# Patient Record
Sex: Female | Born: 1935 | Race: White | Hispanic: No | Smoking: Never smoker
Health system: Southern US, Community
[De-identification: ages and names within clinical notes are randomized; demographics above are authoritative.]

## PROBLEM LIST (undated history)

## (undated) DIAGNOSIS — K219 Gastro-esophageal reflux disease without esophagitis: Secondary | ICD-10-CM

## (undated) DIAGNOSIS — E119 Type 2 diabetes mellitus without complications: Secondary | ICD-10-CM

## (undated) DIAGNOSIS — M1990 Unspecified osteoarthritis, unspecified site: Secondary | ICD-10-CM

## (undated) DIAGNOSIS — R238 Other skin changes: Secondary | ICD-10-CM

## (undated) DIAGNOSIS — N644 Mastodynia: Secondary | ICD-10-CM

## (undated) DIAGNOSIS — G629 Polyneuropathy, unspecified: Secondary | ICD-10-CM

## (undated) DIAGNOSIS — R233 Spontaneous ecchymoses: Secondary | ICD-10-CM

## (undated) DIAGNOSIS — M4310 Spondylolisthesis, site unspecified: Secondary | ICD-10-CM

## (undated) DIAGNOSIS — I10 Essential (primary) hypertension: Secondary | ICD-10-CM

## (undated) DIAGNOSIS — C801 Malignant (primary) neoplasm, unspecified: Secondary | ICD-10-CM

## (undated) DIAGNOSIS — IMO0001 Reserved for inherently not codable concepts without codable children: Secondary | ICD-10-CM

## (undated) DIAGNOSIS — M109 Gout, unspecified: Secondary | ICD-10-CM

## (undated) DIAGNOSIS — T8859XA Other complications of anesthesia, initial encounter: Secondary | ICD-10-CM

## (undated) DIAGNOSIS — T4145XA Adverse effect of unspecified anesthetic, initial encounter: Secondary | ICD-10-CM

## (undated) DIAGNOSIS — E785 Hyperlipidemia, unspecified: Secondary | ICD-10-CM

## (undated) HISTORY — PX: ABDOMINAL HYSTERECTOMY: SHX81

## (undated) HISTORY — PX: BREAST SURGERY: SHX581

---

## 2014-08-09 ENCOUNTER — Other Ambulatory Visit: Payer: Self-pay | Admitting: Neurosurgery

## 2014-08-21 NOTE — Pre-Procedure Instructions (Signed)
SHINIQUA GROSECLOSE  08/21/2014   Your procedure is scheduled on:  Tuesday Aug 29, 2014 at 8:00 AM.  Report to [facility] Admitting at 5:30 AM.  Call this number if you have problems the morning of surgery: [phone]   Remember:   Do not eat food or drink liquids after midnight.   Take these medicines the morning of surgery with A SIP OF WATER: Allopruinol, Amlodipine, Gabapentin, Arimidex, Aricept   Please stop taking any vitamins, Fish oil, Ibuprofen, Advil, Motrin, Alleve, etc   Do NOT take any diabetic medications the morning of your surgery (Ex. Metformin or Glimerpride)    Do not wear jewelry, make-up or nail polish.  Do not wear lotions, powders, or perfumes.  Do not shave 48 hours prior to surgery.  Do not bring valuables to the hospital.  [facility] is not responsible for any belongings or valuables.               Contacts, dentures or bridgework may not be worn into surgery.  Leave suitcase in the car. After surgery it may be brought to your room.  For patients admitted to the hospital, discharge time is determined by your treatment team.               Patients discharged the day of surgery will not be allowed to drive home.  Name and phone number of your driver:   Special Instructions: Shower using CHG soap the night before and the morning of your surgery   Please read over the following fact sheets that you were given: Pain Booklet, Coughing and Deep Breathing, Blood Transfusion Information, MRSA Information and Surgical Site Infection Prevention

## 2014-08-22 ENCOUNTER — Encounter (HOSPITAL_COMMUNITY): Payer: Self-pay

## 2014-08-22 ENCOUNTER — Encounter (HOSPITAL_COMMUNITY)
Admission: RE | Admit: 2014-08-22 | Discharge: 2014-08-22 | Disposition: A | Discharge status: Home / Self Care | Payer: Medicare Other | Source: Ambulatory Visit | Attending: Neurosurgery | Admitting: Neurosurgery

## 2014-08-22 DIAGNOSIS — Z79899 Other long term (current) drug therapy: Secondary | ICD-10-CM | POA: Diagnosis not present

## 2014-08-22 DIAGNOSIS — Z01810 Encounter for preprocedural cardiovascular examination: Secondary | ICD-10-CM | POA: Insufficient documentation

## 2014-08-22 DIAGNOSIS — M4310 Spondylolisthesis, site unspecified: Secondary | ICD-10-CM | POA: Diagnosis not present

## 2014-08-22 DIAGNOSIS — Z0183 Encounter for blood typing: Secondary | ICD-10-CM | POA: Insufficient documentation

## 2014-08-22 DIAGNOSIS — Z01812 Encounter for preprocedural laboratory examination: Secondary | ICD-10-CM | POA: Diagnosis present

## 2014-08-22 HISTORY — DX: Malignant (primary) neoplasm, unspecified: C80.1

## 2014-08-22 HISTORY — DX: Spondylolisthesis, site unspecified: M43.10

## 2014-08-22 HISTORY — DX: Hyperlipidemia, unspecified: E78.5

## 2014-08-22 HISTORY — DX: Type 2 diabetes mellitus without complications: E11.9

## 2014-08-22 HISTORY — DX: Other skin changes: R23.8

## 2014-08-22 HISTORY — DX: Gout, unspecified: M10.9

## 2014-08-22 HISTORY — DX: Gastro-esophageal reflux disease without esophagitis: K21.9

## 2014-08-22 HISTORY — DX: Reserved for inherently not codable concepts without codable children: IMO0001

## 2014-08-22 HISTORY — DX: Unspecified osteoarthritis, unspecified site: M19.90

## 2014-08-22 HISTORY — DX: Mastodynia: N64.4

## 2014-08-22 HISTORY — DX: Other complications of anesthesia, initial encounter: T88.59XA

## 2014-08-22 HISTORY — DX: Adverse effect of unspecified anesthetic, initial encounter: T41.45XA

## 2014-08-22 HISTORY — DX: Polyneuropathy, unspecified: G62.9

## 2014-08-22 HISTORY — DX: Essential (primary) hypertension: I10

## 2014-08-22 HISTORY — DX: Spontaneous ecchymoses: R23.3

## 2014-08-22 LAB — CBC WITH DIFFERENTIAL/PLATELET
BASOS PCT: 1 % (ref 0–1)
Basophils Absolute: 0.1 10*3/uL (ref 0.0–0.1)
EOS PCT: 5 % (ref 0–5)
Eosinophils Absolute: 0.4 10*3/uL (ref 0.0–0.7)
HCT: 41.3 % (ref 36.0–46.0)
Hemoglobin: 14.5 g/dL (ref 12.0–15.0)
LYMPHS ABS: 3.4 10*3/uL (ref 0.7–4.0)
LYMPHS PCT: 48 % — AB (ref 12–46)
MCH: 31.9 pg (ref 26.0–34.0)
MCHC: 35.1 g/dL (ref 30.0–36.0)
MCV: 90.8 fL (ref 78.0–100.0)
MONO ABS: 0.5 10*3/uL (ref 0.1–1.0)
Monocytes Relative: 7 % (ref 3–12)
Neutro Abs: 2.8 10*3/uL (ref 1.7–7.7)
Neutrophils Relative %: 39 % — ABNORMAL LOW (ref 43–77)
Platelets: 235 10*3/uL (ref 150–400)
RBC: 4.55 MIL/uL (ref 3.87–5.11)
RDW: 13.1 % (ref 11.5–15.5)
WBC: 7.1 10*3/uL (ref 4.0–10.5)

## 2014-08-22 LAB — BASIC METABOLIC PANEL
Anion gap: 12 (ref 5–15)
BUN: 18 mg/dL (ref 6–20)
CALCIUM: 10.6 mg/dL — AB (ref 8.9–10.3)
CO2: 21 mmol/L — ABNORMAL LOW (ref 22–32)
Chloride: 105 mmol/L (ref 101–111)
Creatinine, Ser: 0.82 mg/dL (ref 0.44–1.00)
GFR calc Af Amer: >60 mL/min (ref >60)
GLUCOSE: 113 mg/dL — AB (ref 65–99)
Potassium: 3.6 mmol/L (ref 3.5–5.1)
SODIUM: 138 mmol/L (ref 135–145)

## 2014-08-22 LAB — TYPE AND SCREEN
ABO/RH(D): O NEG
ANTIBODY SCREEN: NEGATIVE

## 2014-08-22 LAB — ABO/RH: ABO/RH(D): O NEG

## 2014-08-22 LAB — SURGICAL PCR SCREEN
MRSA, PCR: NEGATIVE
Staphylococcus aureus: NEGATIVE

## 2014-08-22 LAB — GLUCOSE, CAPILLARY: Glucose-Capillary: 112 mg/dL — ABNORMAL HIGH (ref 65–99)

## 2014-08-22 NOTE — Progress Notes (Signed)
Patient arrived to PAT with Amy Rice (patients son). Patient is alert and oriented but had some difficulty recalling past medical history. PCP is [facility] in [place]. Patient denied having any acute cardiac or pulmonary issues. Patients son stated he thought patient had a stress test with Dr. Woody Seller in [place], however he was unsure as to when it occurred, and stated she has not seen Dr Woody Seller in years. Patient denied having a cardiac cath or sleep study.

## 2014-08-23 LAB — HEMOGLOBIN A1C
Hgb A1c MFr Bld: 5.7 % — ABNORMAL HIGH (ref 4.8–5.6)
MEAN PLASMA GLUCOSE: 117 mg/dL

## 2014-08-28 NOTE — Anesthesia Preprocedure Evaluation (Signed)
Anesthesia Evaluation  Patient identified by MRN, date of birth, ID band Patient awake    Reviewed: Allergy & Precautions, NPO status , Patient's Chart, lab work & pertinent test results  History of Anesthesia Complications (+) history of anesthetic complications (confusion)  Airway Mallampati: II  TM Distance: >3 FB Neck ROM: Full    Dental no notable dental hx. (+) Dental Advisory Given   Pulmonary shortness of breath and with exertion,  breath sounds clear to auscultation  Pulmonary exam normal       Cardiovascular hypertension, Pt. on medications Normal cardiovascular examRhythm:Regular Rate:Normal     Neuro/Psych negative neurological ROS  negative psych ROS   GI/Hepatic Neg liver ROS, GERD-  Medicated and Controlled,  Endo/Other  diabetes, Well Controlled, Type 2, Oral Hypoglycemic Agents  Renal/GU negative Renal ROS  negative genitourinary   Musculoskeletal  (+) Arthritis -, Osteoarthritis,    Abdominal   Peds negative pediatric ROS (+)  Hematology negative hematology ROS (+)   Anesthesia Other Findings   Reproductive/Obstetrics negative OB ROS                             Anesthesia Physical Anesthesia Plan  ASA: II  Anesthesia Plan: General   Post-op Pain Management:    Induction: Intravenous  Airway Management Planned: Oral ETT  Additional Equipment:   Intra-op Plan:   Post-operative Plan: Extubation in OR  Informed Consent: I have reviewed the patients History and Physical, chart, labs and discussed the procedure including the risks, benefits and alternatives for the proposed anesthesia with the patient or authorized representative who has indicated his/her understanding and acceptance.   Dental advisory given  Plan Discussed with: CRNA  Anesthesia Plan Comments:         Anesthesia Quick Evaluation

## 2014-08-29 ENCOUNTER — Inpatient Hospital Stay (HOSPITAL_COMMUNITY): Payer: Medicare Other | Admitting: Anesthesiology

## 2014-08-29 ENCOUNTER — Encounter (HOSPITAL_COMMUNITY)
Admission: RE | Disposition: A | Discharge status: Home / Self Care | Payer: Medicare Other | Source: Ambulatory Visit | Attending: Neurosurgery

## 2014-08-29 ENCOUNTER — Inpatient Hospital Stay (HOSPITAL_COMMUNITY): Payer: Medicare Other

## 2014-08-29 ENCOUNTER — Inpatient Hospital Stay (HOSPITAL_COMMUNITY)
Admission: RE | Admit: 2014-08-29 | Discharge: 2014-08-30 | DRG: 460 | Disposition: A | Discharge status: Home / Self Care | Payer: Medicare Other | Source: Ambulatory Visit | Attending: Neurosurgery | Admitting: Neurosurgery

## 2014-08-29 ENCOUNTER — Encounter (HOSPITAL_COMMUNITY): Payer: Self-pay | Admitting: *Deleted

## 2014-08-29 DIAGNOSIS — E785 Hyperlipidemia, unspecified: Secondary | ICD-10-CM | POA: Diagnosis present

## 2014-08-29 DIAGNOSIS — E1142 Type 2 diabetes mellitus with diabetic polyneuropathy: Secondary | ICD-10-CM | POA: Diagnosis present

## 2014-08-29 DIAGNOSIS — M48062 Spinal stenosis, lumbar region with neurogenic claudication: Secondary | ICD-10-CM | POA: Diagnosis present

## 2014-08-29 DIAGNOSIS — Z7982 Long term (current) use of aspirin: Secondary | ICD-10-CM

## 2014-08-29 DIAGNOSIS — K219 Gastro-esophageal reflux disease without esophagitis: Secondary | ICD-10-CM | POA: Diagnosis present

## 2014-08-29 DIAGNOSIS — M549 Dorsalgia, unspecified: Secondary | ICD-10-CM | POA: Diagnosis present

## 2014-08-29 DIAGNOSIS — M4316 Spondylolisthesis, lumbar region: Secondary | ICD-10-CM | POA: Diagnosis present

## 2014-08-29 DIAGNOSIS — I10 Essential (primary) hypertension: Secondary | ICD-10-CM | POA: Diagnosis present

## 2014-08-29 DIAGNOSIS — M4310 Spondylolisthesis, site unspecified: Secondary | ICD-10-CM | POA: Diagnosis present

## 2014-08-29 DIAGNOSIS — M4806 Spinal stenosis, lumbar region: Secondary | ICD-10-CM | POA: Diagnosis present

## 2014-08-29 DIAGNOSIS — Z419 Encounter for procedure for purposes other than remedying health state, unspecified: Secondary | ICD-10-CM

## 2014-08-29 LAB — GLUCOSE, CAPILLARY
GLUCOSE-CAPILLARY: 193 mg/dL — AB (ref 65–99)
Glucose-Capillary: 111 mg/dL — ABNORMAL HIGH (ref 65–99)
Glucose-Capillary: 142 mg/dL — ABNORMAL HIGH (ref 65–99)
Glucose-Capillary: 125 mg/dL — ABNORMAL HIGH (ref 65–99)
Glucose-Capillary: 178 mg/dL — ABNORMAL HIGH (ref 65–99)

## 2014-08-29 SURGERY — POSTERIOR LUMBAR FUSION 1 LEVEL
Anesthesia: General | Site: Back

## 2014-08-29 MED ORDER — ACETAMINOPHEN 650 MG RE SUPP: 650.0000 mg | RECTAL | Status: DC | PRN

## 2014-08-29 MED ORDER — PHENYLEPHRINE 40 MCG/ML (10ML) SYRINGE FOR IV PUSH (FOR BLOOD PRESSURE SUPPORT)
PREFILLED_SYRINGE | INTRAVENOUS | Status: AC
  Filled 2014-08-29: qty 10

## 2014-08-29 MED ORDER — FENTANYL CITRATE (PF) 100 MCG/2ML IJ SOLN
INTRAMUSCULAR | Status: AC
  Filled 2014-08-29: qty 2

## 2014-08-29 MED ORDER — THROMBIN 20000 UNITS EX KIT: PACK | CUTANEOUS | Status: DC | PRN

## 2014-08-29 MED ORDER — FENTANYL CITRATE (PF) 250 MCG/5ML IJ SOLN
INTRAMUSCULAR | Status: AC
  Filled 2014-08-29: qty 5

## 2014-08-29 MED ORDER — ONDANSETRON HCL 4 MG/2ML IJ SOLN: 4.0000 mg | INTRAMUSCULAR | Status: DC | PRN

## 2014-08-29 MED ORDER — DEXAMETHASONE SODIUM PHOSPHATE 10 MG/ML IJ SOLN
10.0000 mg | INTRAMUSCULAR | Status: AC
  Administered 2014-08-29: 10 mg via INTRAVENOUS
  Filled 2014-08-29: qty 1

## 2014-08-29 MED ORDER — ASPIRIN EC 81 MG PO TBEC
81.0000 mg | DELAYED_RELEASE_TABLET | Freq: Every day | ORAL | Status: DC
  Administered 2014-08-30: 81 mg via ORAL
  Filled 2014-08-29: qty 1

## 2014-08-29 MED ORDER — PROPOFOL 10 MG/ML IV BOLUS
INTRAVENOUS | Status: DC | PRN
  Administered 2014-08-29: 100 mg via INTRAVENOUS

## 2014-08-29 MED ORDER — SODIUM CHLORIDE 0.9 % IJ SOLN
3.0000 mL | Freq: Two times a day (BID) | INTRAMUSCULAR | Status: DC
  Administered 2014-08-29: 3 mL via INTRAVENOUS

## 2014-08-29 MED ORDER — SODIUM CHLORIDE 0.9 % IV SOLN: 250.0000 mL | INTRAVENOUS | Status: DC

## 2014-08-29 MED ORDER — DIAZEPAM 5 MG PO TABS
5.0000 mg | ORAL_TABLET | Freq: Four times a day (QID) | ORAL | Status: DC | PRN
  Administered 2014-08-29: 5 mg via ORAL
  Filled 2014-08-29: qty 2

## 2014-08-29 MED ORDER — HEMOSTATIC AGENTS (NO CHARGE) OPTIME: TOPICAL | Status: DC | PRN

## 2014-08-29 MED ORDER — ONDANSETRON HCL 4 MG/2ML IJ SOLN: 4.0000 mg | Freq: Once | INTRAMUSCULAR | Status: DC | PRN

## 2014-08-29 MED ORDER — SODIUM CHLORIDE 0.9 % IJ SOLN: 3.0000 mL | INTRAMUSCULAR | Status: DC | PRN

## 2014-08-29 MED ORDER — VANCOMYCIN HCL 1000 MG IV SOLR
INTRAVENOUS | Status: DC | PRN
  Administered 2014-08-29: 1000 mg

## 2014-08-29 MED ORDER — GLYCOPYRROLATE 0.2 MG/ML IJ SOLN
INTRAMUSCULAR | Status: AC
  Filled 2014-08-29: qty 3

## 2014-08-29 MED ORDER — ALLOPURINOL 100 MG PO TABS
100.0000 mg | ORAL_TABLET | Freq: Two times a day (BID) | ORAL | Status: DC
  Administered 2014-08-29 – 2014-08-30 (×2): 100 mg via ORAL
  Filled 2014-08-29 (×3): qty 1

## 2014-08-29 MED ORDER — SUCCINYLCHOLINE CHLORIDE 20 MG/ML IJ SOLN
INTRAMUSCULAR | Status: AC
  Filled 2014-08-29: qty 1

## 2014-08-29 MED ORDER — GLYCOPYRROLATE 0.2 MG/ML IJ SOLN
INTRAMUSCULAR | Status: AC
  Filled 2014-08-29: qty 2

## 2014-08-29 MED ORDER — BSS IO SOLN
15.0000 mL | Freq: Once | INTRAOCULAR | Status: DC
  Filled 2014-08-29: qty 15

## 2014-08-29 MED ORDER — KETOROLAC TROMETHAMINE 0.5 % OP SOLN
1.0000 [drp] | Freq: Three times a day (TID) | OPHTHALMIC | Status: DC | PRN
Start: 2014-08-29 — End: 2014-08-30
  Administered 2014-08-29: 1 [drp] via OPHTHALMIC
  Filled 2014-08-29: qty 3

## 2014-08-29 MED ORDER — ROCURONIUM BROMIDE 100 MG/10ML IV SOLN
INTRAVENOUS | Status: DC | PRN
  Administered 2014-08-29: 40 mg via INTRAVENOUS
  Administered 2014-08-29 (×2): 10 mg via INTRAVENOUS

## 2014-08-29 MED ORDER — ARTIFICIAL TEARS OP OINT
TOPICAL_OINTMENT | OPHTHALMIC | Status: DC | PRN
  Administered 2014-08-29: 1 via OPHTHALMIC

## 2014-08-29 MED ORDER — LACTATED RINGERS IV SOLN
INTRAVENOUS | Status: DC | PRN
  Administered 2014-08-29 (×2): via INTRAVENOUS

## 2014-08-29 MED ORDER — FENTANYL CITRATE (PF) 100 MCG/2ML IJ SOLN
25.0000 ug | INTRAMUSCULAR | Status: DC | PRN
  Administered 2014-08-29: 50 ug via INTRAVENOUS
  Administered 2014-08-29 (×2): 25 ug via INTRAVENOUS
  Administered 2014-08-29: 50 ug via INTRAVENOUS

## 2014-08-29 MED ORDER — DONEPEZIL HCL 5 MG PO TABS
5.0000 mg | ORAL_TABLET | Freq: Every day | ORAL | Status: DC
  Administered 2014-08-30: 5 mg via ORAL
  Filled 2014-08-29: qty 1

## 2014-08-29 MED ORDER — OXYCODONE-ACETAMINOPHEN 5-325 MG PO TABS: 1.0000 | ORAL_TABLET | ORAL | Status: DC | PRN

## 2014-08-29 MED ORDER — DIAZEPAM 5 MG PO TABS
ORAL_TABLET | ORAL | Status: AC
  Filled 2014-08-29: qty 1

## 2014-08-29 MED ORDER — PRAVASTATIN SODIUM 40 MG PO TABS
40.0000 mg | ORAL_TABLET | Freq: Every day | ORAL | Status: DC
  Administered 2014-08-29: 40 mg via ORAL
  Filled 2014-08-29 (×3): qty 1

## 2014-08-29 MED ORDER — PROPOFOL 10 MG/ML IV BOLUS
INTRAVENOUS | Status: AC
  Filled 2014-08-29: qty 20

## 2014-08-29 MED ORDER — FUROSEMIDE 20 MG PO TABS
20.0000 mg | ORAL_TABLET | Freq: Every day | ORAL | Status: DC
  Administered 2014-08-30: 20 mg via ORAL
  Filled 2014-08-29: qty 1

## 2014-08-29 MED ORDER — 0.9 % SODIUM CHLORIDE (POUR BTL) OPTIME
TOPICAL | Status: DC | PRN
  Administered 2014-08-29: 1000 mL

## 2014-08-29 MED ORDER — PHENYLEPHRINE HCL 10 MG/ML IJ SOLN
INTRAMUSCULAR | Status: AC
  Filled 2014-08-29: qty 1

## 2014-08-29 MED ORDER — ROCURONIUM BROMIDE 50 MG/5ML IV SOLN
INTRAVENOUS | Status: AC
  Filled 2014-08-29: qty 1

## 2014-08-29 MED ORDER — POTASSIUM CHLORIDE CRYS ER 20 MEQ PO TBCR
20.0000 meq | EXTENDED_RELEASE_TABLET | Freq: Every day | ORAL | Status: DC
  Administered 2014-08-30: 20 meq via ORAL
  Filled 2014-08-29: qty 1

## 2014-08-29 MED ORDER — HYDROCODONE-ACETAMINOPHEN 5-325 MG PO TABS
ORAL_TABLET | ORAL | Status: AC
  Filled 2014-08-29: qty 1

## 2014-08-29 MED ORDER — CEFAZOLIN SODIUM 1-5 GM-% IV SOLN
1.0000 g | Freq: Three times a day (TID) | INTRAVENOUS | Status: AC
  Administered 2014-08-29 – 2014-08-30 (×2): 1 g via INTRAVENOUS
  Filled 2014-08-29 (×2): qty 50

## 2014-08-29 MED ORDER — HYDROMORPHONE HCL 1 MG/ML IJ SOLN: 0.5000 mg | INTRAMUSCULAR | Status: DC | PRN

## 2014-08-29 MED ORDER — ARTIFICIAL TEARS OP OINT
TOPICAL_OINTMENT | OPHTHALMIC | Status: AC
  Filled 2014-08-29: qty 3.5

## 2014-08-29 MED ORDER — GLIMEPIRIDE 1 MG PO TABS
1.0000 mg | ORAL_TABLET | Freq: Two times a day (BID) | ORAL | Status: DC
  Administered 2014-08-29 – 2014-08-30 (×2): 1 mg via ORAL
  Filled 2014-08-29 (×5): qty 1

## 2014-08-29 MED ORDER — SODIUM CHLORIDE 0.9 % IR SOLN: Status: DC | PRN

## 2014-08-29 MED ORDER — ONDANSETRON HCL 4 MG/2ML IJ SOLN
INTRAMUSCULAR | Status: DC | PRN
  Administered 2014-08-29: 4 mg via INTRAVENOUS

## 2014-08-29 MED ORDER — FOLIC ACID 400 MCG PO TABS: 400.0000 ug | ORAL_TABLET | Freq: Every day | ORAL | Status: DC

## 2014-08-29 MED ORDER — LISINOPRIL 40 MG PO TABS
40.0000 mg | ORAL_TABLET | Freq: Every day | ORAL | Status: DC
  Administered 2014-08-30: 40 mg via ORAL
  Filled 2014-08-29: qty 1

## 2014-08-29 MED ORDER — LIDOCAINE HCL (CARDIAC) 20 MG/ML IV SOLN
INTRAVENOUS | Status: DC | PRN
  Administered 2014-08-29: 80 mg via INTRAVENOUS

## 2014-08-29 MED ORDER — SODIUM CHLORIDE 0.9 % IV SOLN
10.0000 mg | INTRAVENOUS | Status: DC | PRN
  Administered 2014-08-29: 10 ug/min via INTRAVENOUS

## 2014-08-29 MED ORDER — GLYCOPYRROLATE 0.2 MG/ML IJ SOLN
INTRAMUSCULAR | Status: DC | PRN
  Administered 2014-08-29: 0.6 mg via INTRAVENOUS
  Administered 2014-08-29: 0.4 mg via INTRAVENOUS

## 2014-08-29 MED ORDER — NEOSTIGMINE METHYLSULFATE 10 MG/10ML IV SOLN
INTRAVENOUS | Status: AC
  Filled 2014-08-29: qty 1

## 2014-08-29 MED ORDER — THROMBIN 20000 UNITS EX SOLR
CUTANEOUS | Status: DC | PRN
  Administered 2014-08-29: 09:00:00 via TOPICAL

## 2014-08-29 MED ORDER — FOLIC ACID 0.5 MG HALF TAB
0.5000 mg | ORAL_TABLET | Freq: Every day | ORAL | Status: DC
  Administered 2014-08-30: 0.5 mg via ORAL
  Filled 2014-08-29: qty 1

## 2014-08-29 MED ORDER — POLYMYXIN B-TRIMETHOPRIM 10000-0.1 UNIT/ML-% OP SOLN
1.0000 [drp] | Freq: Three times a day (TID) | OPHTHALMIC | Status: DC
  Administered 2014-08-29 – 2014-08-30 (×2): 1 [drp] via OPHTHALMIC
  Filled 2014-08-29: qty 10

## 2014-08-29 MED ORDER — GABAPENTIN 600 MG PO TABS
600.0000 mg | ORAL_TABLET | Freq: Three times a day (TID) | ORAL | Status: DC
  Administered 2014-08-29 – 2014-08-30 (×3): 600 mg via ORAL
  Filled 2014-08-29 (×6): qty 1

## 2014-08-29 MED ORDER — BUPIVACAINE HCL (PF) 0.25 % IJ SOLN
INTRAMUSCULAR | Status: DC | PRN
  Administered 2014-08-29: 20 mL

## 2014-08-29 MED ORDER — AMLODIPINE BESYLATE 10 MG PO TABS
10.0000 mg | ORAL_TABLET | Freq: Every day | ORAL | Status: DC
  Administered 2014-08-30: 10 mg via ORAL
  Filled 2014-08-29: qty 1

## 2014-08-29 MED ORDER — MIDAZOLAM HCL 2 MG/2ML IJ SOLN
INTRAMUSCULAR | Status: AC
  Filled 2014-08-29: qty 2

## 2014-08-29 MED ORDER — FENTANYL CITRATE (PF) 100 MCG/2ML IJ SOLN
INTRAMUSCULAR | Status: DC | PRN
  Administered 2014-08-29 (×3): 50 ug via INTRAVENOUS
  Administered 2014-08-29: 100 ug via INTRAVENOUS
  Administered 2014-08-29: 50 ug via INTRAVENOUS

## 2014-08-29 MED ORDER — PHENOL 1.4 % MT LIQD: 1.0000 | OROMUCOSAL | Status: DC | PRN

## 2014-08-29 MED ORDER — NEOSTIGMINE METHYLSULFATE 10 MG/10ML IV SOLN
INTRAVENOUS | Status: DC | PRN
  Administered 2014-08-29: 4 mg via INTRAVENOUS

## 2014-08-29 MED ORDER — FENTANYL CITRATE (PF) 100 MCG/2ML IJ SOLN
INTRAMUSCULAR | Status: AC
  Administered 2014-08-29: 100 ug
  Filled 2014-08-29: qty 2

## 2014-08-29 MED ORDER — CEFAZOLIN SODIUM-DEXTROSE 2-3 GM-% IV SOLR
2.0000 g | INTRAVENOUS | Status: AC
  Administered 2014-08-29: 2 g via INTRAVENOUS

## 2014-08-29 MED ORDER — MENTHOL 3 MG MT LOZG: 1.0000 | LOZENGE | OROMUCOSAL | Status: DC | PRN

## 2014-08-29 MED ORDER — PHENYLEPHRINE HCL 10 MG/ML IJ SOLN
INTRAMUSCULAR | Status: DC | PRN
  Administered 2014-08-29 (×5): 80 ug via INTRAVENOUS

## 2014-08-29 MED ORDER — VANCOMYCIN HCL 1000 MG IV SOLR
INTRAVENOUS | Status: AC
  Filled 2014-08-29: qty 1000

## 2014-08-29 MED ORDER — HYDROCODONE-ACETAMINOPHEN 5-325 MG PO TABS
1.0000 | ORAL_TABLET | ORAL | Status: DC | PRN
  Administered 2014-08-29 – 2014-08-30 (×3): 1 via ORAL
  Filled 2014-08-29 (×3): qty 1
  Filled 2014-08-29: qty 2

## 2014-08-29 MED ORDER — BACITRACIN 50000 UNITS IM SOLR
INTRAMUSCULAR | Status: DC | PRN
  Administered 2014-08-29: 500 mL

## 2014-08-29 MED ORDER — ACETAMINOPHEN 325 MG PO TABS: 650.0000 mg | ORAL_TABLET | ORAL | Status: DC | PRN

## 2014-08-29 MED ORDER — LIDOCAINE HCL (CARDIAC) 20 MG/ML IV SOLN
INTRAVENOUS | Status: AC
  Filled 2014-08-29: qty 5

## 2014-08-29 MED ORDER — ONDANSETRON HCL 4 MG/2ML IJ SOLN
INTRAMUSCULAR | Status: AC
  Filled 2014-08-29: qty 2

## 2014-08-29 MED ORDER — METFORMIN HCL 500 MG PO TABS
1000.0000 mg | ORAL_TABLET | Freq: Two times a day (BID) | ORAL | Status: DC
  Administered 2014-08-29 – 2014-08-30 (×2): 1000 mg via ORAL
  Filled 2014-08-29 (×5): qty 2

## 2014-08-29 MED ORDER — ANASTROZOLE 1 MG PO TABS
1.0000 mg | ORAL_TABLET | Freq: Every day | ORAL | Status: DC
  Filled 2014-08-29: qty 1

## 2014-08-29 SURGICAL SUPPLY — 63 items
BAG DECANTER FOR FLEXI CONT (MISCELLANEOUS) ×3 IMPLANT
BENZOIN TINCTURE PRP APPL 2/3 (GAUZE/BANDAGES/DRESSINGS) ×3 IMPLANT
BLADE CLIPPER SURG (BLADE) IMPLANT
BRUSH SCRUB EZ PLAIN DRY (MISCELLANEOUS) ×3 IMPLANT
BUR CUTTER 7.0 ROUND (BURR) ×3 IMPLANT
BUR MATCHSTICK NEURO 3.0 LAGG (BURR) ×3 IMPLANT
CAGE CAPSTONE 10X22X6 (Cage) ×6 IMPLANT
CANISTER SUCT 3000ML PPV (MISCELLANEOUS) ×3 IMPLANT
CAP LCK SPNE (Orthopedic Implant) ×4 IMPLANT
CAP LOCK SPINE RADIUS (Orthopedic Implant) ×4 IMPLANT
CAP LOCKING (Orthopedic Implant) ×8 IMPLANT
CLOSURE WOUND 1/2 X4 (GAUZE/BANDAGES/DRESSINGS) ×1
CONT SPEC 4OZ CLIKSEAL STRL BL (MISCELLANEOUS) ×6 IMPLANT
COVER BACK TABLE 60X90IN (DRAPES) ×3 IMPLANT
DECANTER SPIKE VIAL GLASS SM (MISCELLANEOUS) ×3 IMPLANT
DRAPE C-ARM 42X72 X-RAY (DRAPES) ×6 IMPLANT
DRAPE LAPAROTOMY 100X72X124 (DRAPES) ×3 IMPLANT
DRAPE POUCH INSTRU U-SHP 10X18 (DRAPES) ×3 IMPLANT
DRAPE PROXIMA HALF (DRAPES) IMPLANT
DRAPE SURG 17X23 STRL (DRAPES) ×12 IMPLANT
DRSG OPSITE POSTOP 4X6 (GAUZE/BANDAGES/DRESSINGS) ×3 IMPLANT
DURAPREP 26ML APPLICATOR (WOUND CARE) ×3 IMPLANT
ELECT REM PT RETURN 9FT ADLT (ELECTROSURGICAL) ×3
ELECTRODE REM PT RTRN 9FT ADLT (ELECTROSURGICAL) ×1 IMPLANT
EVACUATOR 1/8 PVC DRAIN (DRAIN) ×3 IMPLANT
GAUZE SPONGE 4X4 12PLY STRL (GAUZE/BANDAGES/DRESSINGS) ×3 IMPLANT
GAUZE SPONGE 4X4 16PLY XRAY LF (GAUZE/BANDAGES/DRESSINGS) IMPLANT
GLOVE BIO SURGEON STRL SZ 6.5 (GLOVE) ×6 IMPLANT
GLOVE BIO SURGEONS STRL SZ 6.5 (GLOVE) ×3
GLOVE ECLIPSE 9.0 STRL (GLOVE) ×6 IMPLANT
GLOVE EXAM NITRILE LRG STRL (GLOVE) IMPLANT
GLOVE EXAM NITRILE MD LF STRL (GLOVE) IMPLANT
GLOVE EXAM NITRILE XL STR (GLOVE) IMPLANT
GLOVE EXAM NITRILE XS STR PU (GLOVE) IMPLANT
GLOVE INDICATOR 6.5 STRL GRN (GLOVE) ×6 IMPLANT
GOWN STRL REUS W/ TWL LRG LVL3 (GOWN DISPOSABLE) ×3 IMPLANT
GOWN STRL REUS W/ TWL XL LVL3 (GOWN DISPOSABLE) ×2 IMPLANT
GOWN STRL REUS W/TWL 2XL LVL3 (GOWN DISPOSABLE) IMPLANT
GOWN STRL REUS W/TWL LRG LVL3 (GOWN DISPOSABLE) ×6
GOWN STRL REUS W/TWL XL LVL3 (GOWN DISPOSABLE) ×4
KIT BASIN OR (CUSTOM PROCEDURE TRAY) ×3 IMPLANT
KIT ROOM TURNOVER OR (KITS) ×3 IMPLANT
LIQUID BAND (GAUZE/BANDAGES/DRESSINGS) ×3 IMPLANT
MILL MEDIUM DISP (BLADE) ×3 IMPLANT
NEEDLE HYPO 22GX1.5 SAFETY (NEEDLE) ×3 IMPLANT
NS IRRIG 1000ML POUR BTL (IV SOLUTION) ×3 IMPLANT
PACK LAMINECTOMY NEURO (CUSTOM PROCEDURE TRAY) ×3 IMPLANT
ROD RADIUS 35MM (Rod) ×3 IMPLANT
ROD RADIUS 40MM (Neuro Prosthesis/Implant) ×2 IMPLANT
ROD SPNL 40X5.5XNS TI RDS (Neuro Prosthesis/Implant) ×1 IMPLANT
SCREW 5.75X40M (Screw) ×6 IMPLANT
SCREW 5.75X45MM (Screw) ×6 IMPLANT
SPONGE SURGIFOAM ABS GEL 100 (HEMOSTASIS) ×3 IMPLANT
STRIP CLOSURE SKIN 1/2X4 (GAUZE/BANDAGES/DRESSINGS) ×2 IMPLANT
SUT VIC AB 0 CT1 18XCR BRD8 (SUTURE) ×2 IMPLANT
SUT VIC AB 0 CT1 8-18 (SUTURE) ×4
SUT VIC AB 2-0 CT1 18 (SUTURE) ×3 IMPLANT
SUT VIC AB 3-0 SH 8-18 (SUTURE) ×6 IMPLANT
SYR 20ML ECCENTRIC (SYRINGE) ×3 IMPLANT
TOWEL OR 17X24 6PK STRL BLUE (TOWEL DISPOSABLE) ×3 IMPLANT
TOWEL OR 17X26 10 PK STRL BLUE (TOWEL DISPOSABLE) ×3 IMPLANT
TRAY FOLEY CATH 14FRSI W/METER (CATHETERS) ×3 IMPLANT
WATER STERILE IRR 1000ML POUR (IV SOLUTION) ×3 IMPLANT

## 2014-08-29 NOTE — Progress Notes (Signed)
Called to evaluate patient just prior to leaving PACU. Right eye red and patient had been rubbing it. No visual disturbances. Amy Rice reports that she feels like "something is in it". No obvious signs of damage. Possible corneal abrasion. However, patient did report that she has had these symptoms for about 2 days. Corneal abrasion orders written. Please call anesthesia with any questions or concerns.

## 2014-08-29 NOTE — Transfer of Care (Signed)
Immediate Anesthesia Transfer of Care Note  Patient: Amy Rice  Procedure(s) Performed: Procedure(s): POSTERIOR LUMBAR FUSION 1 LEVEL (N/A)  Patient Location: PACU  Anesthesia Type:General  Level of Consciousness: awake, alert , oriented and patient cooperative  Airway & Oxygen Therapy: Patient Spontanous Breathing and Patient connected to face mask oxygen  Post-op Assessment: Report given to RN, Post -op Vital signs reviewed and stable and Patient moving all extremities X 4  Post vital signs: Reviewed and stable  Last Vitals:  Filed Vitals:   08/29/14 1024  BP: 129/53  Pulse: 77  Temp:   Resp: 16    Complications: No apparent anesthesia complications

## 2014-08-29 NOTE — Anesthesia Postprocedure Evaluation (Signed)
  Anesthesia Post-op Note  Patient: Amy Rice  Procedure(s) Performed: Procedure(s) (LRB): POSTERIOR LUMBAR INTERBODY FUSION LUMBAR FOUR FIVE (N/A)  Patient Location: PACU  Anesthesia Type: General  Level of Consciousness: awake and alert   Airway and Oxygen Therapy: Patient Spontanous Breathing  Post-op Pain: mild  Post-op Assessment: Post-op Vital signs reviewed, Patient's Cardiovascular Status Stable, Respiratory Function Stable, Patent Airway and No signs of Nausea or vomiting  Last Vitals:  Filed Vitals:   08/29/14 1110  BP: 128/58  Pulse: 63  Temp:   Resp: 16    Post-op Vital Signs: stable   Complications: No apparent anesthesia complications

## 2014-08-29 NOTE — Anesthesia Procedure Notes (Signed)
Procedure Name: Intubation Date/Time: 08/29/2014 8:02 AM Performed by: Rogers Blocker Pre-anesthesia Checklist: Patient identified, Timeout performed, Emergency Drugs available, Suction available and Patient being monitored Patient Re-evaluated:Patient Re-evaluated prior to inductionOxygen Delivery Method: Circle system utilized Preoxygenation: Pre-oxygenation with 100% oxygen Intubation Type: IV induction Ventilation: Mask ventilation without difficulty and Oral airway inserted - appropriate to patient size Laryngoscope Size: Sabra Heck and 2 Grade View: Grade I Tube type: Oral Tube size: 7.5 mm Number of attempts: 1 Airway Equipment and Method: Stylet Placement Confirmation: ETT inserted through vocal cords under direct vision,  breath sounds checked- equal and bilateral,  positive ETCO2 and CO2 detector Secured at: 22 cm Tube secured with: Tape Dental Injury: Teeth and Oropharynx as per pre-operative assessment

## 2014-08-29 NOTE — Brief Op Note (Signed)
08/29/2014  10:20 AM  PATIENT:  Amy Rice  81 y.o. female  PRE-OPERATIVE DIAGNOSIS:  spondylolisthesis  POST-OPERATIVE DIAGNOSIS:  spondylolisthesis  PROCEDURE:  Procedure(s): POSTERIOR LUMBAR FUSION 1 LEVEL (N/A)  SURGEON:  Surgeon(s) and Role:    * Earnie Larsson, MD - Primary    * Consuella Lose, MD - Assisting  PHYSICIAN ASSISTANT:   ASSISTANTS:    ANESTHESIA:   general  EBL:  Total I/O In: 1000 [I.V.:1000] Out: 350 [Urine:150; Blood:200]  BLOOD ADMINISTERED:none  DRAINS: none   LOCAL MEDICATIONS USED:  MARCAINE     SPECIMEN:  No Specimen  DISPOSITION OF SPECIMEN:  N/A  COUNTS:  YES  TOURNIQUET:  * No tourniquets in log *  DICTATION: .Dragon Dictation  PLAN OF CARE: Admit to inpatient   PATIENT DISPOSITION:  PACU - hemodynamically stable.   Delay start of Pharmacological VTE agent (>24hrs) due to surgical blood loss or risk of bleeding: yes

## 2014-08-29 NOTE — Addendum Note (Signed)
Addendum  created 08/29/14 1241 by Lauretta Grill, MD   Modules edited: Clinical Notes, Orders, PRL Based Order Sets   Clinical Notes:  File: [id number]

## 2014-08-29 NOTE — H&P (Signed)
Amy Rice is an 81 y.o. female.   Chief Complaint: Back and bilateral leg pain HPI: 81 year old female with severe progressive neurogenic claudication with progressive bilateral foot drops. Workup demonstrates evidence of an unstable grade 2 L4-5 degenerative spondylolisthesis with severe stenosis. Patient presents now for decompression infusion in hopes of improving her symptoms.  Past Medical History  Diagnosis Date  . Complication of anesthesia     caused increased confusion for a few days after anesthesia  . Diabetes mellitus without complication     Type 2  . Hypertension   . Shortness of breath dyspnea     with ambulation  . GERD (gastroesophageal reflux disease)     at times  . Arthritis     osteoarthritis  . Bruises easily   . Hyperlipidemia   . Mastodynia   . Peripheral neuropathy   . Spondylolisthesis   . Gout   . Cancer     left breast    Past Surgical History  Procedure Laterality Date  . Abdominal hysterectomy    . Breast surgery Left     lumpectomy    History reviewed. No pertinent family history. Social History:  reports that she has never smoked. She does not have any smokeless tobacco history on file. She reports that she does not drink alcohol or use illicit drugs.  Allergies: No Known Allergies  Medications Prior to Admission  Medication Sig Dispense Refill  . allopurinol (ZYLOPRIM) 100 MG tablet Take 100 mg by mouth 2 (two) times daily.    Marland Kitchen amLODipine (NORVASC) 10 MG tablet Take 10 mg by mouth daily.    Marland Kitchen anastrozole (ARIMIDEX) 1 MG tablet Take 1 mg by mouth daily.    Marland Kitchen aspirin EC 81 MG tablet Take 81 mg by mouth daily.    . Cholecalciferol (VITAMIN D3) 2000 UNITS TABS Take 2,000 Units by mouth daily.    . Cyanocobalamin (VITAMIN B-12 PO) Take 1 tablet by mouth daily.    Marland Kitchen donepezil (ARICEPT) 5 MG tablet Take 5 mg by mouth daily.  0  . folic acid (FOLVITE) 195 MCG tablet Take 400 mcg by mouth daily.    . furosemide (LASIX) 20 MG tablet Take 20  mg by mouth daily.    Marland Kitchen gabapentin (NEURONTIN) 600 MG tablet Take 600 mg by mouth 3 (three) times daily.    Marland Kitchen glimepiride (AMARYL) 2 MG tablet Take 1 mg by mouth 2 (two) times daily.    Marland Kitchen lisinopril (PRINIVIL,ZESTRIL) 40 MG tablet Take 40 mg by mouth daily.    . metFORMIN (GLUCOPHAGE) 1000 MG tablet Take 1,000 mg by mouth 2 (two) times daily with a meal.    . Multiple Vitamins-Minerals (CENTRUM SILVER PO) Take 1 tablet by mouth daily.    . Omega-3 Fatty Acids (OMEGA 3 PO) Take 1 tablet by mouth daily.    . potassium chloride SA (K-DUR,KLOR-CON) 20 MEQ tablet Take 20 mEq by mouth daily.    . pravastatin (PRAVACHOL) 40 MG tablet Take 40 mg by mouth daily.    Marland Kitchen ibuprofen (ADVIL,MOTRIN) 200 MG tablet Take 400 mg by mouth every 6 (six) hours as needed (pain).      Results for orders placed or performed during the hospital encounter of 08/29/14 (from the past 48 hour(s))  Glucose, capillary     Status: Abnormal   Collection Time: 08/29/14  6:31 AM  Result Value Ref Range   Glucose-Capillary 111 (H) 65 - 99 mg/dL   No results found.  Review of  Systems  Constitutional: Negative.   HENT: Negative.   Eyes: Negative.   Respiratory: Negative.   Cardiovascular: Negative.   Gastrointestinal: Negative.   Genitourinary: Negative.   Skin: Negative.     Blood pressure 138/61, pulse 58, temperature 97.8 F (36.6 C), temperature source Oral, resp. rate 20, height 5\' 1"  (1.549 m), weight 69.854 kg (154 lb), SpO2 97 %. Physical Exam  Constitutional: She is oriented to person, place, and time. She appears well-developed and well-nourished. No distress.  HENT:  Head: Normocephalic and atraumatic.  Right Ear: External ear normal.  Left Ear: External ear normal.  Nose: Nose normal.  Mouth/Throat: Oropharynx is clear and moist. No oropharyngeal exudate.  Eyes: Conjunctivae and EOM are normal. Pupils are equal, round, and reactive to light. Right eye exhibits no discharge. Left eye exhibits no  discharge.  Neck: Normal range of motion. Neck supple. No tracheal deviation present. No thyromegaly present.  Cardiovascular: Normal rate, regular rhythm, normal heart sounds and intact distal pulses.  Exam reveals no friction rub.   No murmur heard. Respiratory: Effort normal and breath sounds normal. No respiratory distress. She has no wheezes.  GI: Soft. Bowel sounds are normal. She exhibits no distension. There is no tenderness.  Musculoskeletal: Normal range of motion. She exhibits no edema or tenderness.  Neurological: She is alert and oriented to person, place, and time. She has normal reflexes. She displays normal reflexes. No cranial nerve deficit. She exhibits normal muscle tone. Coordination normal.  Left EHL 2 over 5 right EHL 3 over 5 anterior tibialis 4 over 5 bilaterally  Skin: Skin is warm and dry. No rash noted. She is not diaphoretic. No erythema. No pallor.  Psychiatric: She has a normal mood and affect. Her behavior is normal. Judgment and thought content normal.     Assessment/Plan Grade 2 L4-5 unstable degenerative spondylolisthesis with severe stenosis and neurogenic claudication. Plan L4-5 decompressive laminectomy with foraminotomies followed by posterior lumbar interbody fusion utilizing interbody peek cages, locally harvested autograft, and supplemented with posterior lateral arthrodesis utilizing nonsegmental pedicle screw fixation and local autografting. Risks and benefits of been explained. Patient wishes to proceed.  Keltin Baird A 08/29/2014, 7:37 AM

## 2014-08-29 NOTE — Op Note (Signed)
Date of procedure: 08/29/2014  Date of dictation: Same  Service: Neurosurgery  Preoperative diagnosis: Grade 2 L4-5 degenerative spondylolisthesis with severe stenosis and neurogenic claudication  Postoperative diagnosis: Same  Procedure Name: L4-5 decompressive laminectomy with bilateral L4 and L5 decompressive foraminotomies, more than would be required for simple interbody fusion alone.  L4-5 posterior lumbar interbody fusion utilizing interbody peek cages, locally harvested autograft.  L4-5 posterior lateral arthrodesis utilizing nonsegmental pedicle screw instrumentation and local autograft  Surgeon:Erandi Lemma A.Adriaan Maltese, M.D.  Asst. Surgeon: Kathyrn Sheriff  Anesthesia: General  Indication: 81 year old female with severe back and bilateral lower extremity pain consistent with neurogenic like and failing conservative management. Workup demonstrates evidence of an unstable grade 1 L4-5 chair spondylolisthesis with severe stenosis. Patient presents now for decompression and fusion  Operative note: After induction of anesthesia, patient position prone onto Wilson frame and appropriately padded. Lumbar region prepped and draped sterilely. Incision made overlying L4-5. Dissection performed bilaterally. Retractor placed. Fluoroscopy used. Levels confirmed. Decompressive laminectomy performed using Leksell rongeurs Kerrison rongeurs the high-speed drill to remove the entire lamina of L4 the entire inferior facets of L4. The superior facets of L5 and the superior aspect of lamina of L5. Ligament flavum elevated and resected piecemeal fashion. Aggressive, radical foraminotomies were performed on course exiting L4 and L5 nerve roots bilaterally. Bilateral discectomies performed at L4-5. Disc space distracted up to 10 mm with a 10 mm distractor left in place on the left side. Thecal sac and nerve respect on the right side. Disc space reamed and scraped with various curettes. Soft tissue removed and interspace. 10  mm x 6 by 22 mm capstone cage impacted into position and rotated into final place. Distractor removed patient's left side. Disc space prepared once again on the left side. Morselized autograft packed in the interspace. Second cage impacted into position and rotated into final place. Pedicles of L4 and L5 were notified using surface landmarks and intraoperative fluoroscopy. Superficial bone overlying the pedicle was then removed using high-speed drill. Each pedicles and probed using pedicle awl. Each pedicle awl track was probed and found to be solidly within the bone. Each pedicle awl track was then tapped with a 5.250 m screw tap. Each screw tap hole was probed and found to be solidly within the bone. 5.75 x 45 mm screws from [employer] with the radius brand were placed bilaterally at L4. 5.75 x 40 mm screws placed bilaterally at L5. Transverse processes were decorticated using high-speed drill. Morselized autograft packed posterior laterally. Short segment titanium rod placed over the screw heads. Locking caps placed over the screw heads. Locking caps then given a final tightening with the construct under mild compression. Final images revealed good position bone graft hardware at proper upper level with much improved alignment of her spine. Wound is then irrigated one final time. Vancomycin powder was placed the deep wound space as was Gelfoam in the laminectomy space. Wounds and close in layers and bike and sutures. Steri-Strips and sterile dressing were applied. There were no apparent complications. The patient tolerated the procedure well and she returns to the recovery room postop.

## 2014-08-30 LAB — GLUCOSE, CAPILLARY: GLUCOSE-CAPILLARY: 115 mg/dL — AB (ref 65–99)

## 2014-08-30 MED ORDER — HYDROCODONE-ACETAMINOPHEN 5-325 MG PO TABS: 1.0000 | ORAL_TABLET | ORAL | Status: AC | PRN

## 2014-08-30 MED ORDER — DIAZEPAM 5 MG PO TABS: 5.0000 mg | ORAL_TABLET | Freq: Four times a day (QID) | ORAL | Status: AC | PRN

## 2014-08-30 MED FILL — Heparin Sodium (Porcine) Inj 1000 Unit/ML: INTRAMUSCULAR | Qty: 30 | Status: AC

## 2014-08-30 MED FILL — Sodium Chloride IV Soln 0.9%: INTRAVENOUS | Qty: 1000 | Status: AC

## 2014-08-30 NOTE — Discharge Instructions (Signed)

## 2014-08-30 NOTE — Evaluation (Signed)
Physical Therapy Evaluation Patient Details Name: Amy Rice MRN: [id number] DOB: [birth year] Today's Date: 08/30/2014   History of Present Illness  81 y.o. s/p L4-5 posterior lumbar fusion.  Clinical Impression  Pt admitted with above diagnosis. Pt currently with functional limitations due to the deficits listed below (see PT Problem List). At the time of PT eval pt was able to perform transfers and ambulation with min guard assist to supervision for safety. Frequent cueing was required to maintain back precautions, especially with transfers. Pt and daughter report that there has been a functional decline in the RLE stemming from back pathology, and today noted gross weakness of the RLE and eversion of the R ankle during weight bearing. Feel that outpatient PT will be beneficial to improve functional status on the R when deemed appropriate by the MD. Pt will benefit from skilled PT to increase their independence and safety with mobility to allow discharge to the venue listed below.       Follow Up Recommendations Outpatient PT (When appropriate per post-op protocol)    Equipment Recommendations  Rolling walker with 5" wheels;3in1 (PT)    Recommendations for Other Services       Precautions / Restrictions Precautions Precautions: Back;Fall Precaution Booklet Issued: Yes (comment) Precaution Comments: educated on precautions Required Braces or Orthoses: Spinal Brace Spinal Brace: Lumbar corset;Applied in sitting position Restrictions Weight Bearing Restrictions: No      Mobility  Bed Mobility Overal bed mobility: Needs Assistance Bed Mobility: Rolling;Sidelying to Sit Rolling: Min guard Sidelying to sit: Supervision     Sit to sidelying: Supervision General bed mobility comments: Hands-on guarding during mobility to maintain back precautions. Frequent cues for proper log roll technique.   Transfers Overall transfer level: Needs assistance Equipment used: Rolling walker (2  wheeled) Transfers: Sit to/from Stand Sit to Stand: Min guard         General transfer comment: VC's for hand placement on seated surface for safety as well as to maintain back precautions.   Ambulation/Gait Ambulation/Gait assistance: Supervision Ambulation Distance (Feet): 200 Feet Assistive device: Rolling walker (2 wheeled) Gait Pattern/deviations: Step-through pattern;Decreased stride length;Antalgic;Trendelenburg;Decreased weight shift to right Gait velocity: Decreased Gait velocity interpretation: Below normal speed for age/gender General Gait Details: Antalgic gait pattern, which daughter reports has improved since yesterday after surgery. Pt overall doing well - will need RW at d/c.   Stairs Stairs: Yes Stairs assistance: Min guard Stair Management: One rail Right;Step to pattern;Forwards Number of Stairs: 3 General stair comments: HHA for safety. Practiced with 1 railing on the R to simulate front entrance.   Wheelchair Mobility    Modified Rankin (Stroke Patients Only)       Balance Overall balance assessment: Needs assistance Sitting-balance support: Feet supported;No upper extremity supported Sitting balance-Leahy Scale: Fair     Standing balance support: No upper extremity supported Standing balance-Leahy Scale: Fair Standing balance comment: Static standing                             Pertinent Vitals/Pain Pain Assessment: 0-10 Pain Score: 5  Pain Location: back Pain Descriptors / Indicators: Aching Pain Intervention(s): Limited activity within patient's tolerance;Monitored during session;Repositioned    Home Living Family/patient expects to be discharged to:: Private residence Living Arrangements: Alone Available Help at Discharge: Family;Available 24 hours/day (for 1 week) Type of Home: House Home Access: Stairs to enter Entrance Stairs-Rails: None Entrance Stairs-Number of Steps: 2 Home Layout: One level Home  Equipment: Walker -  standard;Shower seat - built in;Grab bars - tub/shower      Prior Function Level of Independence: Independent               Hand Dominance   Dominant Hand: Right    Extremity/Trunk Assessment   Upper Extremity Assessment: Defer to OT evaluation           Lower Extremity Assessment: RLE deficits/detail RLE Deficits / Details: Decreased gross strength and AROM. Noted arch collapse/eversion of the R ankle during weight bearing which daughter reports has been worsening with back pain.     Cervical / Trunk Assessment: Normal  Communication   Communication: No difficulties  Cognition Arousal/Alertness: Awake/alert Behavior During Therapy: WFL for tasks assessed/performed Overall Cognitive Status: Within Functional Limits for tasks assessed                      General Comments      Exercises        Assessment/Plan    PT Assessment Patient needs continued PT services  PT Diagnosis Difficulty walking;Generalized weakness   PT Problem List Decreased strength;Decreased range of motion;Decreased activity tolerance;Decreased balance;Decreased mobility;Decreased knowledge of use of DME;Decreased safety awareness;Decreased knowledge of precautions;Pain  PT Treatment Interventions DME instruction;Gait training;Stair training;Functional mobility training;Therapeutic activities;Therapeutic exercise;Neuromuscular re-education;Patient/family education   PT Goals (Current goals can be found in the Care Plan section) Acute Rehab PT Goals Patient Stated Goal: Be able to get into her high bed PT Goal Formulation: With patient/family Time For Goal Achievement: 09/13/14 Potential to Achieve Goals: Good    Frequency Min 3X/week   Barriers to discharge        Co-evaluation               End of Session Equipment Utilized During Treatment: Back brace Activity Tolerance: Patient tolerated treatment well Patient left: in chair;with call bell/phone within reach;with  family/visitor present Nurse Communication: Mobility status         Time: 9242-6834 PT Time Calculation (min) (ACUTE ONLY): 33 min   Charges:   PT Evaluation $Initial PT Evaluation Tier I: 1 Procedure PT Treatments $Gait Training: 8-22 mins   PT G Codes:        Rolinda Roan 09/15/2014, 10:00 AM   Rolinda Roan, PT, DPT Acute Rehabilitation Services Pager: [phone]

## 2014-08-30 NOTE — Progress Notes (Signed)
Discharge instructions given. Pt verbalized understanding and all questions were answered.  

## 2014-08-30 NOTE — Discharge Summary (Signed)
Physician Discharge Summary  Patient ID: Amy Rice MRN: [id number] DOB/AGE: [id number] 79 y.o.  Admit date: 08/29/2014 Discharge date: 08/30/2014  Admission Diagnoses:  Discharge Diagnoses:  Principal Problem:   Spinal stenosis, lumbar region, with neurogenic claudication Active Problems:   Spondylolisthesis at L4-L5 level   Degenerative spondylolisthesis   Discharged Condition: good  Hospital Course: Patient admitted the hospital where she underwent an uncomplicated Q7-3 decompression and fusion. Postoperatively the patient is done very well. Her preoperative back and leg pain are very much improved. She standing and ambulating without difficulty. She is ready for discharge home.  Consults:   Significant Diagnostic Studies:   Treatments:   Discharge Exam: Blood pressure 127/62, pulse 62, temperature 98.4 F (36.9 C), temperature source Oral, resp. rate 18, height 5\' 1"  (1.549 m), weight 69.854 kg (154 lb), SpO2 96 %. Awake and alert. Oriented and appropriate. Motor and sensory function intact. Wound clean and dry. Chest and abdomen benign.  Disposition: Final discharge disposition not confirmed     Medication List    TAKE these medications        allopurinol 100 MG tablet  Commonly known as:  ZYLOPRIM  Take 100 mg by mouth 2 (two) times daily.     amLODipine 10 MG tablet  Commonly known as:  NORVASC  Take 10 mg by mouth daily.     anastrozole 1 MG tablet  Commonly known as:  ARIMIDEX  Take 1 mg by mouth daily.     aspirin EC 81 MG tablet  Take 81 mg by mouth daily.     CENTRUM SILVER PO  Take 1 tablet by mouth daily.     diazepam 5 MG tablet  Commonly known as:  VALIUM  Take 1-2 tablets (5-10 mg total) by mouth every 6 (six) hours as needed for muscle spasms.     donepezil 5 MG tablet  Commonly known as:  ARICEPT  Take 5 mg by mouth daily.     folic acid 419 MCG tablet  Commonly known as:  FOLVITE  Take 400 mcg by mouth daily.     furosemide 20  MG tablet  Commonly known as:  LASIX  Take 20 mg by mouth daily.     gabapentin 600 MG tablet  Commonly known as:  NEURONTIN  Take 600 mg by mouth 3 (three) times daily.     glimepiride 2 MG tablet  Commonly known as:  AMARYL  Take 1 mg by mouth 2 (two) times daily.     HYDROcodone-acetaminophen 5-325 MG per tablet  Commonly known as:  NORCO/VICODIN  Take 1-2 tablets by mouth every 4 (four) hours as needed (mild pain).     ibuprofen 200 MG tablet  Commonly known as:  ADVIL,MOTRIN  Take 400 mg by mouth every 6 (six) hours as needed (pain).     lisinopril 40 MG tablet  Commonly known as:  PRINIVIL,ZESTRIL  Take 40 mg by mouth daily.     metFORMIN 1000 MG tablet  Commonly known as:  GLUCOPHAGE  Take 1,000 mg by mouth 2 (two) times daily with a meal.     OMEGA 3 PO  Take 1 tablet by mouth daily.     potassium chloride SA 20 MEQ tablet  Commonly known as:  K-DUR,KLOR-CON  Take 20 mEq by mouth daily.     pravastatin 40 MG tablet  Commonly known as:  PRAVACHOL  Take 40 mg by mouth daily.     VITAMIN B-12 PO  Take 1 tablet  by mouth daily.     Vitamin D3 2000 UNITS Tabs  Take 2,000 Units by mouth daily.           Follow-up Information    Follow up with Charlie Pitter, MD.   Specialty:  Neurosurgery   Contact information:   [address] [place] [address] [phone]       Signed: Charlie Pitter 08/30/2014, 8:57 AM

## 2014-08-30 NOTE — Evaluation (Addendum)
Occupational Therapy Evaluation Patient Details Name: Amy Rice MRN: [id number] DOB: May 31, 1931 Today's Date: 08/30/2014    History of Present Illness 81 y.o. s/p L4-5 posterior lumbar fusion.   Clinical Impression   Pt s/p above. Education provided to pt and her daughter. Feel pt is safe to d/c home, with daughter available to assist.     Follow Up Recommendations  No OT follow up;Supervision - Intermittent (OOB/mobility)   Equipment Recommendations  Other (comment) (RW )    Recommendations for Other Services PT consult     Precautions / Restrictions Precautions Precautions: Back;Fall Precaution Booklet Issued: Yes (comment) Precaution Comments: educated on precautions Required Braces or Orthoses: Spinal Brace Spinal Brace: Lumbar corset;Applied in sitting position Restrictions Weight Bearing Restrictions: No      Mobility Bed Mobility Overal bed mobility: Needs Assistance Bed Mobility: Rolling;Sidelying to Sit;Sit to Sidelying Rolling: Supervision;Min assist Sidelying to sit: Supervision     Sit to sidelying: Supervision General bed mobility comments: assist for rolling onto back.  cues for log roll technique. Daughter concerned with pt getting in high bed at home-explained that she may be taught to use step stool (OT discussed with PT)  Transfers Overall transfer level: Needs assistance   Transfers: Sit to/from Stand Sit to Stand: Supervision         General transfer comment: cues for hand placement/technique.    Balance  Unsteady without RW. Balance not formally assessed.                                          ADL Overall ADL's : Needs assistance/impaired Eating/Feeding: Independent;Bed level               Upper Body Dressing : Sitting;Minimal assistance   Lower Body Dressing: Sit to/from stand;Set up;Supervision/safety   Toilet Transfer: Min guard;Supervision/safety;Ambulation;RW (bed; Min guard without RW)    Toileting- Clothing Manipulation and Hygiene: Sit to/from stand;Set up;Supervision/safety   Tub/ Shower Transfer: Minimal assistance;Ambulation;Rolling walker;Walk-in shower   Functional mobility during ADLs: Supervision/safety;Min guard;Rolling walker General ADL Comments: Educated on LB ADL technique-pt able to cross legs over knees. Educated on AE and explained sometimes being able to cross legs over knees will vary. Discussed incorporating precautions into functional activities. Explained use of cup for oral care and placement of grooming items to avoid breaking precautions. Educated on safety such as safe footwear, use of bag on walker, pet, rugs/items on floor, and recommended someone be with her for shower transfer. Educated on shower transfer technique and pt practiced with walker. Educated on back brace.  Educated on car transfer technique. Discussed positioning of pillows and recommended not sitting for longer than one hour at a time.  Explained what pt could use for toilet aide if it is an issue.     Vision     Perception     Praxis      Pertinent Vitals/Pain Pain Assessment: 0-10 Pain Score: 5  Pain Location: back Pain Descriptors / Indicators: Tender Pain Intervention(s): Monitored during session     Hand Dominance Right   Extremity/Trunk Assessment Upper Extremity Assessment Upper Extremity Assessment: Overall WFL for tasks assessed   Lower Extremity Assessment Lower Extremity Assessment: Defer to PT evaluation       Communication Communication Communication: No difficulties   Cognition Arousal/Alertness: Awake/alert Behavior During Therapy: WFL for tasks assessed/performed Overall Cognitive Status: Within Functional Limits for tasks assessed  General Comments       Exercises       Shoulder Instructions      Home Living Family/patient expects to be discharged to:: Private residence Living Arrangements: Alone Available  Help at Discharge: Family;Available 24 hours/day (for one week) Type of Home: House Home Access: Stairs to enter [employer] of Steps: 2 Entrance Stairs-Rails: None Home Layout: One level     Bathroom Shower/Tub: Occupational psychologist: Standard     Home Equipment: Walker - standard;Shower seat - built in;Grab bars - tub/shower          Prior Functioning/Environment Level of Independence: Independent             OT Diagnosis: Acute pain   OT Problem List:     OT Treatment/Interventions:      OT Goals(Current goals can be found in the care plan section)    OT Frequency:     Barriers to D/C:            Co-evaluation              End of Session Equipment Utilized During Treatment: Rolling walker;Gait belt;Back brace Nurse Communication: Mobility status;Other (comment) (no OT followup)  Activity Tolerance: Patient tolerated treatment well Patient left: in bed;with family/visitor present   Time: 4825-0037 OT Time Calculation (min): 33 min Charges:  OT General Charges $OT Visit: 1 Procedure OT Evaluation $Initial OT Evaluation Tier I: 1 Procedure OT Treatments $Self Care/Home Management : 8-22 mins G-CodesBenito Mccreedy OTR/L [phone] 08/30/2014, 9:39 AM

## 2015-02-07 DIAGNOSIS — Z853 Personal history of malignant neoplasm of breast: Secondary | ICD-10-CM | POA: Diagnosis not present

## 2015-02-07 DIAGNOSIS — Z170 Estrogen receptor positive status [ER+]: Secondary | ICD-10-CM | POA: Diagnosis not present

## 2017-01-28 IMAGING — RF DG C-ARM 61-120 MIN
1 series · 2 of 2 positions shown · non-contrast
Comparison: Lumbar spine series of December 20, 2013

CLINICAL DATA: Status post L4-L5 PLIF

EXAM:
DG C-ARM 61-120 MIN; LUMBAR SPINE - 2-3 VIEW
FLUOROSCOPY TIME:  Fluoroscopy Time (in minutes and seconds): 0
minutes, 22 seconds
Number of Acquired Images:  2

[Series 1: run · 2 of 2 slices shown]
[im 1/2]
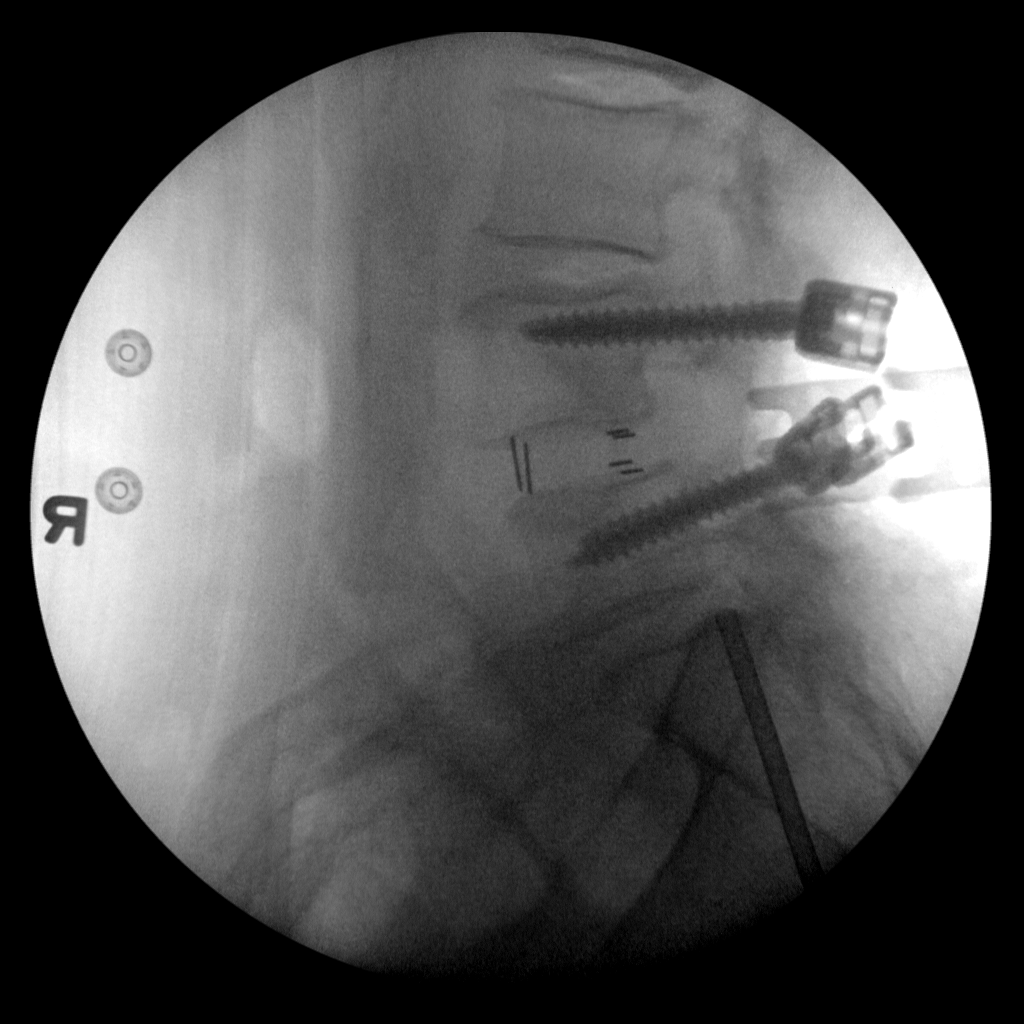
[im 2/2]
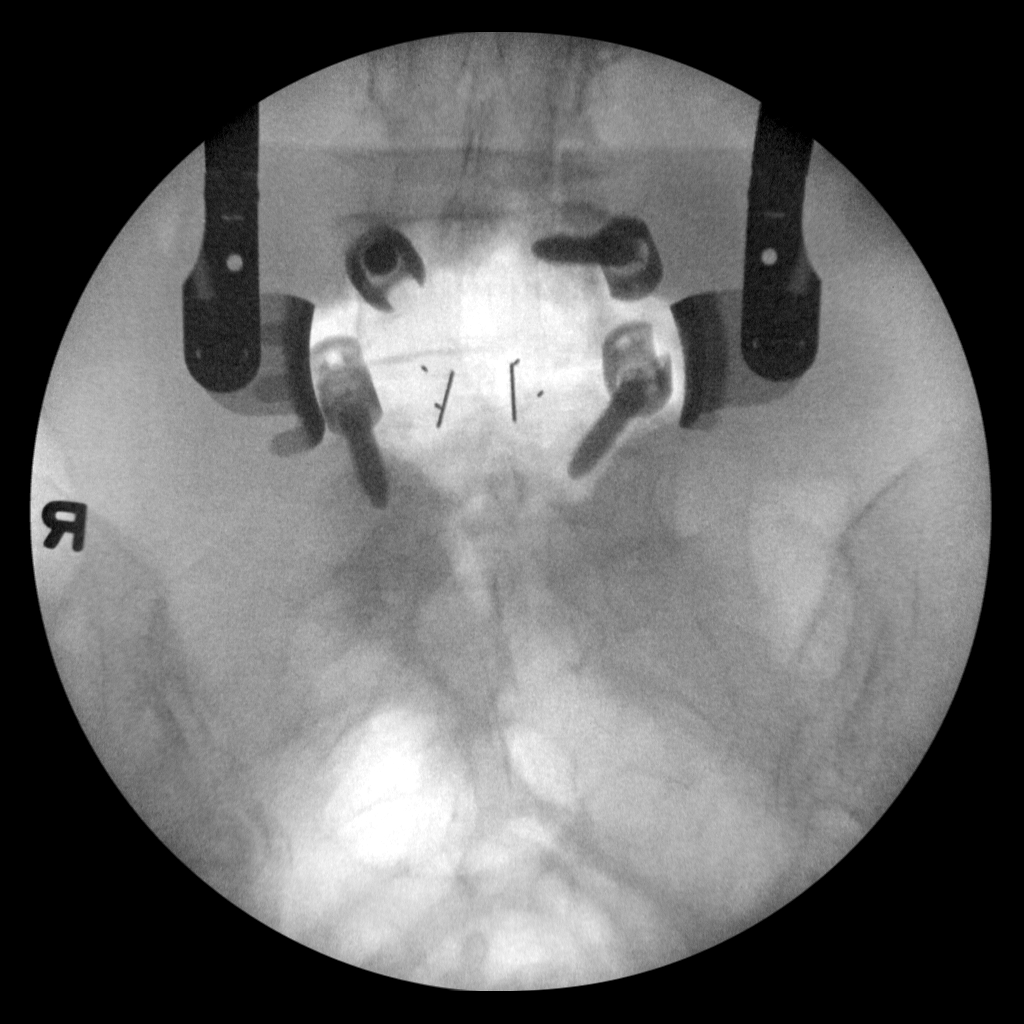

[2 of 2 positions shown; findings below may reference images not displayed]

FINDINGS: Two fluoro spot images reveal the patient to be undergoing PLIF at
the L4-5 levels. The intradiscal device markers are in reasonable
position. There are pedicle screws at L4 and L5 bilaterally. The
connecting rod has not been as yet placed.
IMPRESSION: The patient is undergoing PLIF at L4-5 without immediate
complication noted.

## 2022-09-08 DIAGNOSIS — K219 Gastro-esophageal reflux disease without esophagitis: Secondary | ICD-10-CM

## 2022-09-08 DIAGNOSIS — E1122 Type 2 diabetes mellitus with diabetic chronic kidney disease: Secondary | ICD-10-CM | POA: Diagnosis not present

## 2022-09-08 DIAGNOSIS — M109 Gout, unspecified: Secondary | ICD-10-CM

## 2022-09-08 DIAGNOSIS — N182 Chronic kidney disease, stage 2 (mild): Secondary | ICD-10-CM | POA: Diagnosis not present

## 2022-09-08 DIAGNOSIS — M1990 Unspecified osteoarthritis, unspecified site: Secondary | ICD-10-CM

## 2022-09-08 DIAGNOSIS — I129 Hypertensive chronic kidney disease with stage 1 through stage 4 chronic kidney disease, or unspecified chronic kidney disease: Secondary | ICD-10-CM | POA: Diagnosis not present

## 2022-09-08 DIAGNOSIS — E1140 Type 2 diabetes mellitus with diabetic neuropathy, unspecified: Secondary | ICD-10-CM | POA: Diagnosis not present

## 2022-09-08 DIAGNOSIS — G894 Chronic pain syndrome: Secondary | ICD-10-CM

## 2022-09-08 DIAGNOSIS — F0390 Unspecified dementia without behavioral disturbance: Secondary | ICD-10-CM

## 2022-09-11 DIAGNOSIS — F03918 Unspecified dementia, unspecified severity, with other behavioral disturbance: Secondary | ICD-10-CM | POA: Diagnosis not present

## 2022-09-11 DIAGNOSIS — R451 Restlessness and agitation: Secondary | ICD-10-CM | POA: Diagnosis not present

## 2022-09-12 DIAGNOSIS — F03918 Unspecified dementia, unspecified severity, with other behavioral disturbance: Secondary | ICD-10-CM | POA: Diagnosis not present

## 2022-09-12 DIAGNOSIS — R451 Restlessness and agitation: Secondary | ICD-10-CM | POA: Diagnosis not present

## 2022-09-15 DIAGNOSIS — R2689 Other abnormalities of gait and mobility: Secondary | ICD-10-CM | POA: Diagnosis not present

## 2022-09-15 DIAGNOSIS — R269 Unspecified abnormalities of gait and mobility: Secondary | ICD-10-CM | POA: Diagnosis not present

## 2022-09-15 DIAGNOSIS — F0390 Unspecified dementia without behavioral disturbance: Secondary | ICD-10-CM

## 2022-09-15 DIAGNOSIS — S51811A Laceration without foreign body of right forearm, initial encounter: Secondary | ICD-10-CM | POA: Diagnosis not present

## 2022-09-15 DIAGNOSIS — Z9181 History of falling: Secondary | ICD-10-CM

## 2022-09-15 DIAGNOSIS — M6281 Muscle weakness (generalized): Secondary | ICD-10-CM | POA: Diagnosis not present

## 2022-09-15 DIAGNOSIS — W19XXXA Unspecified fall, initial encounter: Secondary | ICD-10-CM

## 2022-09-19 DIAGNOSIS — F0390 Unspecified dementia without behavioral disturbance: Secondary | ICD-10-CM | POA: Diagnosis not present

## 2022-09-19 DIAGNOSIS — S41101A Unspecified open wound of right upper arm, initial encounter: Secondary | ICD-10-CM | POA: Diagnosis not present

## 2022-09-19 DIAGNOSIS — L03113 Cellulitis of right upper limb: Secondary | ICD-10-CM | POA: Diagnosis not present

## 2022-09-19 DIAGNOSIS — E11622 Type 2 diabetes mellitus with other skin ulcer: Secondary | ICD-10-CM | POA: Diagnosis not present

## 2022-09-24 DIAGNOSIS — R52 Pain, unspecified: Secondary | ICD-10-CM | POA: Diagnosis not present

## 2022-09-24 DIAGNOSIS — C4490 Unspecified malignant neoplasm of skin, unspecified: Secondary | ICD-10-CM | POA: Diagnosis not present

## 2022-09-24 DIAGNOSIS — F0390 Unspecified dementia without behavioral disturbance: Secondary | ICD-10-CM | POA: Diagnosis not present

## 2022-09-24 DIAGNOSIS — L989 Disorder of the skin and subcutaneous tissue, unspecified: Secondary | ICD-10-CM | POA: Diagnosis not present

## 2022-09-25 DIAGNOSIS — R0989 Other specified symptoms and signs involving the circulatory and respiratory systems: Secondary | ICD-10-CM | POA: Diagnosis not present

## 2022-09-25 DIAGNOSIS — F0390 Unspecified dementia without behavioral disturbance: Secondary | ICD-10-CM | POA: Diagnosis not present

## 2022-09-25 DIAGNOSIS — R531 Weakness: Secondary | ICD-10-CM | POA: Diagnosis not present

## 2022-09-25 DIAGNOSIS — R5381 Other malaise: Secondary | ICD-10-CM | POA: Diagnosis not present

## 2022-10-01 DIAGNOSIS — W19XXXA Unspecified fall, initial encounter: Secondary | ICD-10-CM

## 2022-10-01 DIAGNOSIS — Z9181 History of falling: Secondary | ICD-10-CM

## 2022-10-01 DIAGNOSIS — R2689 Other abnormalities of gait and mobility: Secondary | ICD-10-CM

## 2022-10-01 DIAGNOSIS — S0083XA Contusion of other part of head, initial encounter: Secondary | ICD-10-CM

## 2022-10-01 DIAGNOSIS — R269 Unspecified abnormalities of gait and mobility: Secondary | ICD-10-CM

## 2022-10-01 DIAGNOSIS — M6281 Muscle weakness (generalized): Secondary | ICD-10-CM

## 2022-10-01 DIAGNOSIS — F0390 Unspecified dementia without behavioral disturbance: Secondary | ICD-10-CM

## 2022-10-15 DIAGNOSIS — R451 Restlessness and agitation: Secondary | ICD-10-CM | POA: Diagnosis not present

## 2022-10-15 DIAGNOSIS — F03918 Unspecified dementia, unspecified severity, with other behavioral disturbance: Secondary | ICD-10-CM | POA: Diagnosis not present

## 2022-10-15 DIAGNOSIS — Z87440 Personal history of urinary (tract) infections: Secondary | ICD-10-CM | POA: Diagnosis not present

## 2022-10-15 DIAGNOSIS — L304 Erythema intertrigo: Secondary | ICD-10-CM | POA: Diagnosis not present

## 2022-10-20 DIAGNOSIS — Z87440 Personal history of urinary (tract) infections: Secondary | ICD-10-CM | POA: Diagnosis not present

## 2022-10-20 DIAGNOSIS — R208 Other disturbances of skin sensation: Secondary | ICD-10-CM | POA: Diagnosis not present

## 2022-10-20 DIAGNOSIS — R600 Localized edema: Secondary | ICD-10-CM | POA: Diagnosis not present

## 2022-10-20 DIAGNOSIS — F03918 Unspecified dementia, unspecified severity, with other behavioral disturbance: Secondary | ICD-10-CM | POA: Diagnosis not present

## 2022-10-31 DIAGNOSIS — F0390 Unspecified dementia without behavioral disturbance: Secondary | ICD-10-CM | POA: Diagnosis not present

## 2022-10-31 DIAGNOSIS — R634 Abnormal weight loss: Secondary | ICD-10-CM | POA: Diagnosis not present

## 2022-10-31 DIAGNOSIS — Z6826 Body mass index (BMI) 26.0-26.9, adult: Secondary | ICD-10-CM | POA: Diagnosis not present

## 2022-11-28 DIAGNOSIS — R197 Diarrhea, unspecified: Secondary | ICD-10-CM | POA: Diagnosis not present

## 2022-11-28 DIAGNOSIS — F0390 Unspecified dementia without behavioral disturbance: Secondary | ICD-10-CM | POA: Diagnosis not present

## 2022-12-10 DIAGNOSIS — F0390 Unspecified dementia without behavioral disturbance: Secondary | ICD-10-CM | POA: Diagnosis not present

## 2022-12-10 DIAGNOSIS — M6281 Muscle weakness (generalized): Secondary | ICD-10-CM | POA: Diagnosis not present

## 2022-12-10 DIAGNOSIS — Z9181 History of falling: Secondary | ICD-10-CM

## 2022-12-10 DIAGNOSIS — R2689 Other abnormalities of gait and mobility: Secondary | ICD-10-CM | POA: Diagnosis not present

## 2022-12-10 DIAGNOSIS — S2020XA Contusion of thorax, unspecified, initial encounter: Secondary | ICD-10-CM | POA: Diagnosis not present

## 2022-12-10 DIAGNOSIS — W19XXXA Unspecified fall, initial encounter: Secondary | ICD-10-CM

## 2022-12-12 DIAGNOSIS — L539 Erythematous condition, unspecified: Secondary | ICD-10-CM | POA: Diagnosis not present

## 2022-12-12 DIAGNOSIS — L304 Erythema intertrigo: Secondary | ICD-10-CM | POA: Diagnosis not present

## 2022-12-12 DIAGNOSIS — F0390 Unspecified dementia without behavioral disturbance: Secondary | ICD-10-CM | POA: Diagnosis not present

## 2022-12-29 DIAGNOSIS — R600 Localized edema: Secondary | ICD-10-CM | POA: Diagnosis not present

## 2022-12-29 DIAGNOSIS — S90111A Contusion of right great toe without damage to nail, initial encounter: Secondary | ICD-10-CM | POA: Diagnosis not present

## 2022-12-29 DIAGNOSIS — F0390 Unspecified dementia without behavioral disturbance: Secondary | ICD-10-CM | POA: Diagnosis not present

## 2022-12-29 DIAGNOSIS — R52 Pain, unspecified: Secondary | ICD-10-CM | POA: Diagnosis not present

## 2022-12-31 DIAGNOSIS — Z87440 Personal history of urinary (tract) infections: Secondary | ICD-10-CM | POA: Diagnosis not present

## 2022-12-31 DIAGNOSIS — N390 Urinary tract infection, site not specified: Secondary | ICD-10-CM | POA: Diagnosis not present

## 2022-12-31 DIAGNOSIS — F0390 Unspecified dementia without behavioral disturbance: Secondary | ICD-10-CM | POA: Diagnosis not present

## 2023-01-02 DIAGNOSIS — F0390 Unspecified dementia without behavioral disturbance: Secondary | ICD-10-CM | POA: Diagnosis not present

## 2023-01-02 DIAGNOSIS — R635 Abnormal weight gain: Secondary | ICD-10-CM | POA: Diagnosis not present

## 2023-01-07 DIAGNOSIS — N390 Urinary tract infection, site not specified: Secondary | ICD-10-CM | POA: Diagnosis not present

## 2023-01-07 DIAGNOSIS — F03918 Unspecified dementia, unspecified severity, with other behavioral disturbance: Secondary | ICD-10-CM | POA: Diagnosis not present

## 2023-01-07 DIAGNOSIS — R456 Violent behavior: Secondary | ICD-10-CM | POA: Diagnosis not present

## 2023-01-09 DIAGNOSIS — Z23 Encounter for immunization: Secondary | ICD-10-CM | POA: Diagnosis not present

## 2023-01-12 DIAGNOSIS — R2689 Other abnormalities of gait and mobility: Secondary | ICD-10-CM

## 2023-01-12 DIAGNOSIS — M6281 Muscle weakness (generalized): Secondary | ICD-10-CM

## 2023-01-12 DIAGNOSIS — F03918 Unspecified dementia, unspecified severity, with other behavioral disturbance: Secondary | ICD-10-CM

## 2023-01-12 DIAGNOSIS — Z9181 History of falling: Secondary | ICD-10-CM

## 2023-01-12 DIAGNOSIS — W19XXXA Unspecified fall, initial encounter: Secondary | ICD-10-CM

## 2023-02-11 DIAGNOSIS — F419 Anxiety disorder, unspecified: Secondary | ICD-10-CM | POA: Diagnosis not present

## 2023-02-11 DIAGNOSIS — R451 Restlessness and agitation: Secondary | ICD-10-CM

## 2023-02-11 DIAGNOSIS — Z9181 History of falling: Secondary | ICD-10-CM

## 2023-02-11 DIAGNOSIS — Z87440 Personal history of urinary (tract) infections: Secondary | ICD-10-CM | POA: Diagnosis not present

## 2023-02-11 DIAGNOSIS — F03918 Unspecified dementia, unspecified severity, with other behavioral disturbance: Secondary | ICD-10-CM | POA: Diagnosis not present

## 2023-02-11 DIAGNOSIS — M6281 Muscle weakness (generalized): Secondary | ICD-10-CM | POA: Diagnosis not present

## 2023-02-12 DIAGNOSIS — Z9181 History of falling: Secondary | ICD-10-CM

## 2023-02-12 DIAGNOSIS — R2689 Other abnormalities of gait and mobility: Secondary | ICD-10-CM

## 2023-02-12 DIAGNOSIS — M6281 Muscle weakness (generalized): Secondary | ICD-10-CM

## 2023-02-12 DIAGNOSIS — W19XXXA Unspecified fall, initial encounter: Secondary | ICD-10-CM

## 2023-02-12 DIAGNOSIS — F03918 Unspecified dementia, unspecified severity, with other behavioral disturbance: Secondary | ICD-10-CM

## 2023-02-18 DIAGNOSIS — M6281 Muscle weakness (generalized): Secondary | ICD-10-CM | POA: Diagnosis not present

## 2023-02-18 DIAGNOSIS — S5011XA Contusion of right forearm, initial encounter: Secondary | ICD-10-CM | POA: Diagnosis not present

## 2023-02-18 DIAGNOSIS — W19XXXA Unspecified fall, initial encounter: Secondary | ICD-10-CM

## 2023-02-18 DIAGNOSIS — Z9181 History of falling: Secondary | ICD-10-CM

## 2023-02-18 DIAGNOSIS — R0782 Intercostal pain: Secondary | ICD-10-CM | POA: Diagnosis not present

## 2023-02-18 DIAGNOSIS — R2689 Other abnormalities of gait and mobility: Secondary | ICD-10-CM | POA: Diagnosis not present

## 2023-02-18 DIAGNOSIS — F03918 Unspecified dementia, unspecified severity, with other behavioral disturbance: Secondary | ICD-10-CM | POA: Diagnosis not present

## 2023-02-19 DIAGNOSIS — R2689 Other abnormalities of gait and mobility: Secondary | ICD-10-CM | POA: Diagnosis not present

## 2023-02-19 DIAGNOSIS — Z9181 History of falling: Secondary | ICD-10-CM | POA: Diagnosis not present

## 2023-02-19 DIAGNOSIS — W19XXXA Unspecified fall, initial encounter: Secondary | ICD-10-CM

## 2023-02-19 DIAGNOSIS — F03918 Unspecified dementia, unspecified severity, with other behavioral disturbance: Secondary | ICD-10-CM | POA: Diagnosis not present

## 2023-02-19 DIAGNOSIS — M6281 Muscle weakness (generalized): Secondary | ICD-10-CM | POA: Diagnosis not present

## 2023-02-26 DIAGNOSIS — Z9181 History of falling: Secondary | ICD-10-CM

## 2023-02-26 DIAGNOSIS — R2689 Other abnormalities of gait and mobility: Secondary | ICD-10-CM | POA: Diagnosis not present

## 2023-02-26 DIAGNOSIS — M25552 Pain in left hip: Secondary | ICD-10-CM | POA: Diagnosis not present

## 2023-02-26 DIAGNOSIS — F0390 Unspecified dementia without behavioral disturbance: Secondary | ICD-10-CM

## 2023-02-26 DIAGNOSIS — M6281 Muscle weakness (generalized): Secondary | ICD-10-CM | POA: Diagnosis not present

## 2023-02-26 DIAGNOSIS — M25512 Pain in left shoulder: Secondary | ICD-10-CM | POA: Diagnosis not present

## 2023-02-26 DIAGNOSIS — W19XXXA Unspecified fall, initial encounter: Secondary | ICD-10-CM

## 2023-02-27 DIAGNOSIS — M6281 Muscle weakness (generalized): Secondary | ICD-10-CM

## 2023-02-27 DIAGNOSIS — Z9181 History of falling: Secondary | ICD-10-CM

## 2023-02-27 DIAGNOSIS — F03918 Unspecified dementia, unspecified severity, with other behavioral disturbance: Secondary | ICD-10-CM

## 2023-02-27 DIAGNOSIS — K219 Gastro-esophageal reflux disease without esophagitis: Secondary | ICD-10-CM | POA: Diagnosis not present

## 2023-02-27 DIAGNOSIS — M1990 Unspecified osteoarthritis, unspecified site: Secondary | ICD-10-CM

## 2023-02-27 DIAGNOSIS — M109 Gout, unspecified: Secondary | ICD-10-CM | POA: Diagnosis not present

## 2023-02-27 DIAGNOSIS — G894 Chronic pain syndrome: Secondary | ICD-10-CM | POA: Diagnosis not present

## 2023-02-27 DIAGNOSIS — I129 Hypertensive chronic kidney disease with stage 1 through stage 4 chronic kidney disease, or unspecified chronic kidney disease: Secondary | ICD-10-CM | POA: Diagnosis not present

## 2023-03-30 DIAGNOSIS — R21 Rash and other nonspecific skin eruption: Secondary | ICD-10-CM | POA: Diagnosis not present

## 2023-03-30 DIAGNOSIS — F0390 Unspecified dementia without behavioral disturbance: Secondary | ICD-10-CM | POA: Diagnosis not present

## 2023-04-24 DIAGNOSIS — R2689 Other abnormalities of gait and mobility: Secondary | ICD-10-CM

## 2023-04-24 DIAGNOSIS — F0390 Unspecified dementia without behavioral disturbance: Secondary | ICD-10-CM

## 2023-04-24 DIAGNOSIS — M6281 Muscle weakness (generalized): Secondary | ICD-10-CM

## 2023-04-24 DIAGNOSIS — Z9181 History of falling: Secondary | ICD-10-CM

## 2023-05-01 DIAGNOSIS — F0390 Unspecified dementia without behavioral disturbance: Secondary | ICD-10-CM | POA: Diagnosis not present

## 2023-05-01 DIAGNOSIS — Z9181 History of falling: Secondary | ICD-10-CM | POA: Diagnosis not present

## 2023-05-01 DIAGNOSIS — R2689 Other abnormalities of gait and mobility: Secondary | ICD-10-CM | POA: Diagnosis not present

## 2023-05-01 DIAGNOSIS — M6281 Muscle weakness (generalized): Secondary | ICD-10-CM | POA: Diagnosis not present

## 2023-05-08 DIAGNOSIS — F0390 Unspecified dementia without behavioral disturbance: Secondary | ICD-10-CM | POA: Diagnosis not present

## 2023-05-08 DIAGNOSIS — K219 Gastro-esophageal reflux disease without esophagitis: Secondary | ICD-10-CM | POA: Diagnosis not present

## 2023-05-08 DIAGNOSIS — E119 Type 2 diabetes mellitus without complications: Secondary | ICD-10-CM | POA: Diagnosis not present

## 2023-05-11 DIAGNOSIS — R2689 Other abnormalities of gait and mobility: Secondary | ICD-10-CM | POA: Diagnosis not present

## 2023-05-11 DIAGNOSIS — Z9181 History of falling: Secondary | ICD-10-CM

## 2023-05-11 DIAGNOSIS — S0083XA Contusion of other part of head, initial encounter: Secondary | ICD-10-CM | POA: Diagnosis not present

## 2023-05-11 DIAGNOSIS — F0390 Unspecified dementia without behavioral disturbance: Secondary | ICD-10-CM | POA: Diagnosis not present

## 2023-05-11 DIAGNOSIS — R451 Restlessness and agitation: Secondary | ICD-10-CM

## 2023-05-11 DIAGNOSIS — M6281 Muscle weakness (generalized): Secondary | ICD-10-CM | POA: Diagnosis not present

## 2023-05-15 DIAGNOSIS — Z87440 Personal history of urinary (tract) infections: Secondary | ICD-10-CM | POA: Diagnosis not present

## 2023-05-15 DIAGNOSIS — N390 Urinary tract infection, site not specified: Secondary | ICD-10-CM | POA: Diagnosis not present

## 2023-05-15 DIAGNOSIS — F0390 Unspecified dementia without behavioral disturbance: Secondary | ICD-10-CM | POA: Diagnosis not present

## 2023-05-22 DIAGNOSIS — S0083XA Contusion of other part of head, initial encounter: Secondary | ICD-10-CM

## 2023-05-22 DIAGNOSIS — Z9181 History of falling: Secondary | ICD-10-CM

## 2023-05-22 DIAGNOSIS — S01112A Laceration without foreign body of left eyelid and periocular area, initial encounter: Secondary | ICD-10-CM

## 2023-05-22 DIAGNOSIS — R2689 Other abnormalities of gait and mobility: Secondary | ICD-10-CM

## 2023-05-22 DIAGNOSIS — M6281 Muscle weakness (generalized): Secondary | ICD-10-CM

## 2023-05-25 DIAGNOSIS — M6281 Muscle weakness (generalized): Secondary | ICD-10-CM | POA: Diagnosis not present

## 2023-05-25 DIAGNOSIS — R2689 Other abnormalities of gait and mobility: Secondary | ICD-10-CM | POA: Diagnosis not present

## 2023-05-25 DIAGNOSIS — F0390 Unspecified dementia without behavioral disturbance: Secondary | ICD-10-CM | POA: Diagnosis not present

## 2023-05-25 DIAGNOSIS — Z9181 History of falling: Secondary | ICD-10-CM | POA: Diagnosis not present

## 2023-06-01 DIAGNOSIS — K649 Unspecified hemorrhoids: Secondary | ICD-10-CM | POA: Diagnosis not present

## 2023-06-01 DIAGNOSIS — F0390 Unspecified dementia without behavioral disturbance: Secondary | ICD-10-CM | POA: Diagnosis not present

## 2023-06-03 DIAGNOSIS — F0390 Unspecified dementia without behavioral disturbance: Secondary | ICD-10-CM | POA: Diagnosis not present

## 2023-06-03 DIAGNOSIS — Z87440 Personal history of urinary (tract) infections: Secondary | ICD-10-CM | POA: Diagnosis not present

## 2023-06-03 DIAGNOSIS — N390 Urinary tract infection, site not specified: Secondary | ICD-10-CM | POA: Diagnosis not present

## 2023-06-05 DIAGNOSIS — F0390 Unspecified dementia without behavioral disturbance: Secondary | ICD-10-CM | POA: Diagnosis not present

## 2023-06-05 DIAGNOSIS — R2689 Other abnormalities of gait and mobility: Secondary | ICD-10-CM | POA: Diagnosis not present

## 2023-06-05 DIAGNOSIS — M6281 Muscle weakness (generalized): Secondary | ICD-10-CM | POA: Diagnosis not present

## 2023-06-05 DIAGNOSIS — Z9181 History of falling: Secondary | ICD-10-CM | POA: Diagnosis not present

## 2023-06-24 DIAGNOSIS — R531 Weakness: Secondary | ICD-10-CM | POA: Diagnosis not present

## 2023-06-24 DIAGNOSIS — F0390 Unspecified dementia without behavioral disturbance: Secondary | ICD-10-CM | POA: Diagnosis not present

## 2023-06-24 DIAGNOSIS — M6281 Muscle weakness (generalized): Secondary | ICD-10-CM | POA: Diagnosis not present

## 2023-06-24 DIAGNOSIS — R296 Repeated falls: Secondary | ICD-10-CM | POA: Diagnosis not present

## 2023-06-25 DIAGNOSIS — R531 Weakness: Secondary | ICD-10-CM | POA: Diagnosis not present

## 2023-06-25 DIAGNOSIS — R296 Repeated falls: Secondary | ICD-10-CM | POA: Diagnosis not present

## 2023-06-25 DIAGNOSIS — M109 Gout, unspecified: Secondary | ICD-10-CM | POA: Diagnosis not present

## 2023-06-25 DIAGNOSIS — E1142 Type 2 diabetes mellitus with diabetic polyneuropathy: Secondary | ICD-10-CM | POA: Diagnosis not present

## 2023-07-03 DIAGNOSIS — M1990 Unspecified osteoarthritis, unspecified site: Secondary | ICD-10-CM

## 2023-07-03 DIAGNOSIS — E1140 Type 2 diabetes mellitus with diabetic neuropathy, unspecified: Secondary | ICD-10-CM | POA: Diagnosis not present

## 2023-07-03 DIAGNOSIS — E785 Hyperlipidemia, unspecified: Secondary | ICD-10-CM

## 2023-07-03 DIAGNOSIS — M6281 Muscle weakness (generalized): Secondary | ICD-10-CM

## 2023-07-03 DIAGNOSIS — E559 Vitamin D deficiency, unspecified: Secondary | ICD-10-CM

## 2023-07-03 DIAGNOSIS — N183 Chronic kidney disease, stage 3 unspecified: Secondary | ICD-10-CM | POA: Diagnosis not present

## 2023-07-03 DIAGNOSIS — I129 Hypertensive chronic kidney disease with stage 1 through stage 4 chronic kidney disease, or unspecified chronic kidney disease: Secondary | ICD-10-CM | POA: Diagnosis not present

## 2023-07-03 DIAGNOSIS — F03918 Unspecified dementia, unspecified severity, with other behavioral disturbance: Secondary | ICD-10-CM

## 2023-07-03 DIAGNOSIS — K219 Gastro-esophageal reflux disease without esophagitis: Secondary | ICD-10-CM

## 2023-07-03 DIAGNOSIS — G894 Chronic pain syndrome: Secondary | ICD-10-CM

## 2023-07-03 DIAGNOSIS — E1122 Type 2 diabetes mellitus with diabetic chronic kidney disease: Secondary | ICD-10-CM | POA: Diagnosis not present

## 2023-07-06 DIAGNOSIS — F0390 Unspecified dementia without behavioral disturbance: Secondary | ICD-10-CM | POA: Diagnosis not present

## 2023-07-06 DIAGNOSIS — L853 Xerosis cutis: Secondary | ICD-10-CM | POA: Diagnosis not present

## 2023-07-06 DIAGNOSIS — L989 Disorder of the skin and subcutaneous tissue, unspecified: Secondary | ICD-10-CM | POA: Diagnosis not present

## 2023-07-15 DIAGNOSIS — R2689 Other abnormalities of gait and mobility: Secondary | ICD-10-CM | POA: Diagnosis not present

## 2023-07-15 DIAGNOSIS — F03918 Unspecified dementia, unspecified severity, with other behavioral disturbance: Secondary | ICD-10-CM | POA: Diagnosis not present

## 2023-07-15 DIAGNOSIS — S50812A Abrasion of left forearm, initial encounter: Secondary | ICD-10-CM | POA: Diagnosis not present

## 2023-07-15 DIAGNOSIS — M6281 Muscle weakness (generalized): Secondary | ICD-10-CM | POA: Diagnosis not present

## 2023-07-23 DIAGNOSIS — R2689 Other abnormalities of gait and mobility: Secondary | ICD-10-CM | POA: Diagnosis not present

## 2023-07-23 DIAGNOSIS — M6281 Muscle weakness (generalized): Secondary | ICD-10-CM | POA: Diagnosis not present

## 2023-07-23 DIAGNOSIS — F03918 Unspecified dementia, unspecified severity, with other behavioral disturbance: Secondary | ICD-10-CM | POA: Diagnosis not present

## 2023-07-23 DIAGNOSIS — R451 Restlessness and agitation: Secondary | ICD-10-CM | POA: Diagnosis not present

## 2023-08-07 DIAGNOSIS — F0390 Unspecified dementia without behavioral disturbance: Secondary | ICD-10-CM | POA: Diagnosis not present

## 2023-08-07 DIAGNOSIS — K649 Unspecified hemorrhoids: Secondary | ICD-10-CM | POA: Diagnosis not present

## 2023-08-07 DIAGNOSIS — M6281 Muscle weakness (generalized): Secondary | ICD-10-CM | POA: Diagnosis not present

## 2023-08-07 DIAGNOSIS — K625 Hemorrhage of anus and rectum: Secondary | ICD-10-CM | POA: Diagnosis not present

## 2023-08-10 DIAGNOSIS — B9620 Unspecified Escherichia coli [E. coli] as the cause of diseases classified elsewhere: Secondary | ICD-10-CM | POA: Diagnosis not present

## 2023-08-10 DIAGNOSIS — Z87440 Personal history of urinary (tract) infections: Secondary | ICD-10-CM | POA: Diagnosis not present

## 2023-08-10 DIAGNOSIS — F0390 Unspecified dementia without behavioral disturbance: Secondary | ICD-10-CM | POA: Diagnosis not present

## 2023-08-10 DIAGNOSIS — N390 Urinary tract infection, site not specified: Secondary | ICD-10-CM | POA: Diagnosis not present

## 2023-08-12 DIAGNOSIS — M25552 Pain in left hip: Secondary | ICD-10-CM | POA: Diagnosis not present

## 2023-08-12 DIAGNOSIS — M25551 Pain in right hip: Secondary | ICD-10-CM | POA: Diagnosis not present

## 2023-08-12 DIAGNOSIS — R52 Pain, unspecified: Secondary | ICD-10-CM

## 2023-08-12 DIAGNOSIS — N390 Urinary tract infection, site not specified: Secondary | ICD-10-CM | POA: Diagnosis not present

## 2023-08-12 DIAGNOSIS — G894 Chronic pain syndrome: Secondary | ICD-10-CM | POA: Diagnosis not present

## 2023-08-12 DIAGNOSIS — M6281 Muscle weakness (generalized): Secondary | ICD-10-CM

## 2023-08-14 DIAGNOSIS — N390 Urinary tract infection, site not specified: Secondary | ICD-10-CM | POA: Diagnosis not present

## 2023-08-14 DIAGNOSIS — R5381 Other malaise: Secondary | ICD-10-CM | POA: Diagnosis not present

## 2023-08-14 DIAGNOSIS — F0390 Unspecified dementia without behavioral disturbance: Secondary | ICD-10-CM | POA: Diagnosis not present

## 2023-08-14 DIAGNOSIS — Z87440 Personal history of urinary (tract) infections: Secondary | ICD-10-CM | POA: Diagnosis not present

## 2023-08-24 DIAGNOSIS — E1142 Type 2 diabetes mellitus with diabetic polyneuropathy: Secondary | ICD-10-CM | POA: Diagnosis not present

## 2023-08-24 DIAGNOSIS — R296 Repeated falls: Secondary | ICD-10-CM | POA: Diagnosis not present

## 2023-08-24 DIAGNOSIS — R531 Weakness: Secondary | ICD-10-CM | POA: Diagnosis not present

## 2023-08-24 DIAGNOSIS — G309 Alzheimer's disease, unspecified: Secondary | ICD-10-CM | POA: Diagnosis not present

## 2023-09-04 DIAGNOSIS — R634 Abnormal weight loss: Secondary | ICD-10-CM | POA: Diagnosis not present

## 2023-09-04 DIAGNOSIS — Z9181 History of falling: Secondary | ICD-10-CM

## 2023-09-04 DIAGNOSIS — R2689 Other abnormalities of gait and mobility: Secondary | ICD-10-CM | POA: Diagnosis not present

## 2023-09-04 DIAGNOSIS — M6281 Muscle weakness (generalized): Secondary | ICD-10-CM | POA: Diagnosis not present

## 2023-09-04 DIAGNOSIS — F03918 Unspecified dementia, unspecified severity, with other behavioral disturbance: Secondary | ICD-10-CM | POA: Diagnosis not present

## 2023-09-07 DIAGNOSIS — R1310 Dysphagia, unspecified: Secondary | ICD-10-CM | POA: Diagnosis not present

## 2023-09-07 DIAGNOSIS — F0390 Unspecified dementia without behavioral disturbance: Secondary | ICD-10-CM | POA: Diagnosis not present

## 2023-09-07 DIAGNOSIS — R634 Abnormal weight loss: Secondary | ICD-10-CM | POA: Diagnosis not present

## 2023-09-23 DIAGNOSIS — R2689 Other abnormalities of gait and mobility: Secondary | ICD-10-CM | POA: Diagnosis not present

## 2023-09-23 DIAGNOSIS — M6281 Muscle weakness (generalized): Secondary | ICD-10-CM | POA: Diagnosis not present

## 2023-09-23 DIAGNOSIS — F03918 Unspecified dementia, unspecified severity, with other behavioral disturbance: Secondary | ICD-10-CM | POA: Diagnosis not present

## 2023-09-23 DIAGNOSIS — Z9181 History of falling: Secondary | ICD-10-CM | POA: Diagnosis not present

## 2023-09-30 DIAGNOSIS — M6281 Muscle weakness (generalized): Secondary | ICD-10-CM | POA: Diagnosis not present

## 2023-09-30 DIAGNOSIS — F03918 Unspecified dementia, unspecified severity, with other behavioral disturbance: Secondary | ICD-10-CM | POA: Diagnosis not present

## 2023-09-30 DIAGNOSIS — E1140 Type 2 diabetes mellitus with diabetic neuropathy, unspecified: Secondary | ICD-10-CM | POA: Diagnosis not present

## 2023-09-30 DIAGNOSIS — R2689 Other abnormalities of gait and mobility: Secondary | ICD-10-CM | POA: Diagnosis not present

## 2023-09-30 DIAGNOSIS — Z9181 History of falling: Secondary | ICD-10-CM

## 2023-10-07 DIAGNOSIS — F0390 Unspecified dementia without behavioral disturbance: Secondary | ICD-10-CM | POA: Diagnosis not present

## 2023-10-07 DIAGNOSIS — M6281 Muscle weakness (generalized): Secondary | ICD-10-CM | POA: Diagnosis not present

## 2023-10-07 DIAGNOSIS — L989 Disorder of the skin and subcutaneous tissue, unspecified: Secondary | ICD-10-CM | POA: Diagnosis not present

## 2023-10-19 DIAGNOSIS — R2689 Other abnormalities of gait and mobility: Secondary | ICD-10-CM | POA: Diagnosis not present

## 2023-10-19 DIAGNOSIS — M6281 Muscle weakness (generalized): Secondary | ICD-10-CM | POA: Diagnosis not present

## 2023-10-19 DIAGNOSIS — F0390 Unspecified dementia without behavioral disturbance: Secondary | ICD-10-CM | POA: Diagnosis not present

## 2023-10-19 DIAGNOSIS — Z9181 History of falling: Secondary | ICD-10-CM | POA: Diagnosis not present

## 2023-10-28 DIAGNOSIS — Z9181 History of falling: Secondary | ICD-10-CM

## 2023-10-28 DIAGNOSIS — R2689 Other abnormalities of gait and mobility: Secondary | ICD-10-CM | POA: Diagnosis not present

## 2023-10-28 DIAGNOSIS — M6281 Muscle weakness (generalized): Secondary | ICD-10-CM | POA: Diagnosis not present

## 2023-10-28 DIAGNOSIS — N390 Urinary tract infection, site not specified: Secondary | ICD-10-CM | POA: Diagnosis not present

## 2023-10-28 DIAGNOSIS — B9620 Unspecified Escherichia coli [E. coli] as the cause of diseases classified elsewhere: Secondary | ICD-10-CM

## 2023-10-28 DIAGNOSIS — F0390 Unspecified dementia without behavioral disturbance: Secondary | ICD-10-CM | POA: Diagnosis not present

## 2023-10-29 DIAGNOSIS — L989 Disorder of the skin and subcutaneous tissue, unspecified: Secondary | ICD-10-CM | POA: Diagnosis not present

## 2023-10-29 DIAGNOSIS — F0390 Unspecified dementia without behavioral disturbance: Secondary | ICD-10-CM | POA: Diagnosis not present

## 2023-10-29 DIAGNOSIS — L299 Pruritus, unspecified: Secondary | ICD-10-CM | POA: Diagnosis not present

## 2023-11-26 DIAGNOSIS — E1140 Type 2 diabetes mellitus with diabetic neuropathy, unspecified: Secondary | ICD-10-CM | POA: Diagnosis not present

## 2023-11-26 DIAGNOSIS — E785 Hyperlipidemia, unspecified: Secondary | ICD-10-CM

## 2023-11-26 DIAGNOSIS — M6281 Muscle weakness (generalized): Secondary | ICD-10-CM

## 2023-11-26 DIAGNOSIS — M109 Gout, unspecified: Secondary | ICD-10-CM

## 2023-11-26 DIAGNOSIS — G894 Chronic pain syndrome: Secondary | ICD-10-CM

## 2023-11-26 DIAGNOSIS — E1122 Type 2 diabetes mellitus with diabetic chronic kidney disease: Secondary | ICD-10-CM | POA: Diagnosis not present

## 2023-11-26 DIAGNOSIS — E559 Vitamin D deficiency, unspecified: Secondary | ICD-10-CM

## 2023-11-26 DIAGNOSIS — F0390 Unspecified dementia without behavioral disturbance: Secondary | ICD-10-CM

## 2023-11-26 DIAGNOSIS — I129 Hypertensive chronic kidney disease with stage 1 through stage 4 chronic kidney disease, or unspecified chronic kidney disease: Secondary | ICD-10-CM | POA: Diagnosis not present

## 2023-11-26 DIAGNOSIS — N183 Chronic kidney disease, stage 3 unspecified: Secondary | ICD-10-CM | POA: Diagnosis not present

## 2023-11-26 DIAGNOSIS — M1990 Unspecified osteoarthritis, unspecified site: Secondary | ICD-10-CM

## 2023-11-26 DIAGNOSIS — F419 Anxiety disorder, unspecified: Secondary | ICD-10-CM

## 2023-11-26 DIAGNOSIS — K219 Gastro-esophageal reflux disease without esophagitis: Secondary | ICD-10-CM

## 2023-12-03 DIAGNOSIS — F0390 Unspecified dementia without behavioral disturbance: Secondary | ICD-10-CM

## 2023-12-03 DIAGNOSIS — E785 Hyperlipidemia, unspecified: Secondary | ICD-10-CM | POA: Diagnosis not present

## 2023-12-03 DIAGNOSIS — E1159 Type 2 diabetes mellitus with other circulatory complications: Secondary | ICD-10-CM | POA: Diagnosis not present

## 2023-12-03 DIAGNOSIS — E559 Vitamin D deficiency, unspecified: Secondary | ICD-10-CM | POA: Diagnosis not present

## 2023-12-09 DIAGNOSIS — R21 Rash and other nonspecific skin eruption: Secondary | ICD-10-CM | POA: Diagnosis not present

## 2023-12-09 DIAGNOSIS — F0390 Unspecified dementia without behavioral disturbance: Secondary | ICD-10-CM | POA: Diagnosis not present

## 2023-12-09 DIAGNOSIS — M6281 Muscle weakness (generalized): Secondary | ICD-10-CM | POA: Diagnosis not present

## 2023-12-16 DIAGNOSIS — Z9181 History of falling: Secondary | ICD-10-CM

## 2023-12-16 DIAGNOSIS — M6281 Muscle weakness (generalized): Secondary | ICD-10-CM | POA: Diagnosis not present

## 2023-12-16 DIAGNOSIS — S51041A Puncture wound with foreign body of right elbow, initial encounter: Secondary | ICD-10-CM | POA: Diagnosis not present

## 2023-12-16 DIAGNOSIS — F03918 Unspecified dementia, unspecified severity, with other behavioral disturbance: Secondary | ICD-10-CM | POA: Diagnosis not present

## 2023-12-16 DIAGNOSIS — R2689 Other abnormalities of gait and mobility: Secondary | ICD-10-CM | POA: Diagnosis not present

## 2023-12-25 DIAGNOSIS — Z23 Encounter for immunization: Secondary | ICD-10-CM | POA: Diagnosis not present

## 2024-01-29 DIAGNOSIS — F03918 Unspecified dementia, unspecified severity, with other behavioral disturbance: Secondary | ICD-10-CM | POA: Diagnosis not present

## 2024-01-29 DIAGNOSIS — E559 Vitamin D deficiency, unspecified: Secondary | ICD-10-CM | POA: Diagnosis not present

## 2024-02-12 DIAGNOSIS — M6281 Muscle weakness (generalized): Secondary | ICD-10-CM | POA: Diagnosis not present

## 2024-02-12 DIAGNOSIS — R350 Frequency of micturition: Secondary | ICD-10-CM | POA: Diagnosis not present

## 2024-02-12 DIAGNOSIS — F0390 Unspecified dementia without behavioral disturbance: Secondary | ICD-10-CM | POA: Diagnosis not present

## 2024-02-12 DIAGNOSIS — W19XXXA Unspecified fall, initial encounter: Secondary | ICD-10-CM | POA: Diagnosis not present

## 2024-02-17 DIAGNOSIS — Z9181 History of falling: Secondary | ICD-10-CM | POA: Diagnosis not present

## 2024-02-17 DIAGNOSIS — M6281 Muscle weakness (generalized): Secondary | ICD-10-CM | POA: Diagnosis not present

## 2024-02-17 DIAGNOSIS — F0390 Unspecified dementia without behavioral disturbance: Secondary | ICD-10-CM | POA: Diagnosis not present

## 2024-02-17 DIAGNOSIS — N390 Urinary tract infection, site not specified: Secondary | ICD-10-CM | POA: Diagnosis not present

## 2024-02-22 DIAGNOSIS — L299 Pruritus, unspecified: Secondary | ICD-10-CM | POA: Diagnosis not present

## 2024-02-22 DIAGNOSIS — Z9181 History of falling: Secondary | ICD-10-CM | POA: Diagnosis not present

## 2024-02-22 DIAGNOSIS — R21 Rash and other nonspecific skin eruption: Secondary | ICD-10-CM | POA: Diagnosis not present

## 2024-02-22 DIAGNOSIS — F03918 Unspecified dementia, unspecified severity, with other behavioral disturbance: Secondary | ICD-10-CM | POA: Diagnosis not present

## 2024-02-22 DIAGNOSIS — R262 Difficulty in walking, not elsewhere classified: Secondary | ICD-10-CM | POA: Diagnosis not present

## 2024-02-22 DIAGNOSIS — N390 Urinary tract infection, site not specified: Secondary | ICD-10-CM | POA: Diagnosis not present
